# Patient Record
Sex: Female | Born: 1965
Health system: Southern US, Community
[De-identification: ages and names within clinical notes are randomized; demographics above are authoritative.]

## PROBLEM LIST (undated history)

## (undated) DIAGNOSIS — F32A Depression, unspecified: Secondary | ICD-10-CM

## (undated) DIAGNOSIS — F329 Major depressive disorder, single episode, unspecified: Secondary | ICD-10-CM

## (undated) HISTORY — DX: Depression, unspecified: F32.A

---
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
## 2018-07-11 DIAGNOSIS — Z01419 Encounter for gynecological examination (general) (routine) without abnormal findings: Secondary | ICD-10-CM | POA: Diagnosis not present

## 2018-07-11 DIAGNOSIS — Z0000 Encounter for general adult medical examination without abnormal findings: Secondary | ICD-10-CM | POA: Diagnosis not present

## 2018-07-11 DIAGNOSIS — Z6836 Body mass index (BMI) 36.0-36.9, adult: Secondary | ICD-10-CM | POA: Diagnosis not present

## 2018-07-11 DIAGNOSIS — Z30433 Encounter for removal and reinsertion of intrauterine contraceptive device: Secondary | ICD-10-CM | POA: Diagnosis not present

## 2018-07-27 DIAGNOSIS — Z1212 Encounter for screening for malignant neoplasm of rectum: Secondary | ICD-10-CM | POA: Diagnosis not present

## 2018-07-27 DIAGNOSIS — Z1211 Encounter for screening for malignant neoplasm of colon: Secondary | ICD-10-CM | POA: Diagnosis not present

## 2018-07-29 DIAGNOSIS — R928 Other abnormal and inconclusive findings on diagnostic imaging of breast: Secondary | ICD-10-CM | POA: Diagnosis not present

## 2018-11-03 DIAGNOSIS — Z0000 Encounter for general adult medical examination without abnormal findings: Secondary | ICD-10-CM | POA: Diagnosis not present

## 2018-11-03 DIAGNOSIS — F329 Major depressive disorder, single episode, unspecified: Secondary | ICD-10-CM | POA: Diagnosis not present

## 2018-11-03 DIAGNOSIS — R635 Abnormal weight gain: Secondary | ICD-10-CM | POA: Diagnosis not present

## 2018-11-03 DIAGNOSIS — Z6835 Body mass index (BMI) 35.0-35.9, adult: Secondary | ICD-10-CM | POA: Diagnosis not present

## 2018-12-03 DIAGNOSIS — F329 Major depressive disorder, single episode, unspecified: Secondary | ICD-10-CM | POA: Diagnosis not present

## 2018-12-03 DIAGNOSIS — G4700 Insomnia, unspecified: Secondary | ICD-10-CM | POA: Diagnosis not present

## 2018-12-03 DIAGNOSIS — R635 Abnormal weight gain: Secondary | ICD-10-CM | POA: Diagnosis not present

## 2019-01-05 DIAGNOSIS — G4700 Insomnia, unspecified: Secondary | ICD-10-CM | POA: Diagnosis not present

## 2019-01-05 DIAGNOSIS — F419 Anxiety disorder, unspecified: Secondary | ICD-10-CM | POA: Diagnosis not present

## 2019-01-05 DIAGNOSIS — R635 Abnormal weight gain: Secondary | ICD-10-CM | POA: Diagnosis not present

## 2019-02-09 DIAGNOSIS — F419 Anxiety disorder, unspecified: Secondary | ICD-10-CM | POA: Diagnosis not present

## 2019-02-09 DIAGNOSIS — R531 Weakness: Secondary | ICD-10-CM | POA: Diagnosis not present

## 2019-02-09 DIAGNOSIS — Z13220 Encounter for screening for lipoid disorders: Secondary | ICD-10-CM | POA: Diagnosis not present

## 2019-02-09 DIAGNOSIS — R112 Nausea with vomiting, unspecified: Secondary | ICD-10-CM | POA: Diagnosis not present

## 2019-02-24 DIAGNOSIS — R42 Dizziness and giddiness: Secondary | ICD-10-CM | POA: Diagnosis not present

## 2019-02-24 DIAGNOSIS — R531 Weakness: Secondary | ICD-10-CM | POA: Diagnosis not present

## 2019-02-24 DIAGNOSIS — G4709 Other insomnia: Secondary | ICD-10-CM | POA: Diagnosis not present

## 2019-02-24 DIAGNOSIS — E538 Deficiency of other specified B group vitamins: Secondary | ICD-10-CM | POA: Diagnosis not present

## 2019-03-11 DIAGNOSIS — G4709 Other insomnia: Secondary | ICD-10-CM | POA: Diagnosis not present

## 2019-03-11 DIAGNOSIS — F419 Anxiety disorder, unspecified: Secondary | ICD-10-CM | POA: Diagnosis not present

## 2019-03-31 DIAGNOSIS — R42 Dizziness and giddiness: Secondary | ICD-10-CM | POA: Diagnosis not present

## 2019-03-31 DIAGNOSIS — R61 Generalized hyperhidrosis: Secondary | ICD-10-CM | POA: Diagnosis not present

## 2019-05-25 DIAGNOSIS — R42 Dizziness and giddiness: Secondary | ICD-10-CM | POA: Diagnosis not present

## 2019-05-25 DIAGNOSIS — R61 Generalized hyperhidrosis: Secondary | ICD-10-CM | POA: Diagnosis not present

## 2019-07-06 DIAGNOSIS — M542 Cervicalgia: Secondary | ICD-10-CM | POA: Diagnosis not present

## 2019-07-06 DIAGNOSIS — F419 Anxiety disorder, unspecified: Secondary | ICD-10-CM | POA: Diagnosis not present

## 2019-07-06 DIAGNOSIS — M545 Low back pain: Secondary | ICD-10-CM | POA: Diagnosis not present

## 2019-07-23 DIAGNOSIS — F419 Anxiety disorder, unspecified: Secondary | ICD-10-CM | POA: Diagnosis not present

## 2019-07-23 DIAGNOSIS — M545 Low back pain: Secondary | ICD-10-CM | POA: Diagnosis not present

## 2019-07-24 DIAGNOSIS — Z01419 Encounter for gynecological examination (general) (routine) without abnormal findings: Secondary | ICD-10-CM | POA: Diagnosis not present

## 2019-07-24 DIAGNOSIS — Z6838 Body mass index (BMI) 38.0-38.9, adult: Secondary | ICD-10-CM | POA: Diagnosis not present

## 2019-07-24 DIAGNOSIS — Z13220 Encounter for screening for lipoid disorders: Secondary | ICD-10-CM | POA: Diagnosis not present

## 2019-07-24 DIAGNOSIS — Z0000 Encounter for general adult medical examination without abnormal findings: Secondary | ICD-10-CM | POA: Diagnosis not present

## 2019-07-24 LAB — TSH: TSH: 0.59 (ref 0.41–5.90)

## 2019-07-24 LAB — HEPATIC FUNCTION PANEL
ALT: 14 (ref 7–35)
AST: 19 (ref 13–35)

## 2019-07-24 LAB — LIPID PANEL
Cholesterol: 166 (ref 0–200)
HDL: 58 (ref 35–70)
LDL Cholesterol: 105
Triglycerides: 53 (ref 40–160)

## 2019-07-24 LAB — BASIC METABOLIC PANEL
Creatinine: 0.9 (ref 0.5–1.1)
Glucose: 77

## 2019-07-24 LAB — CBC AND DIFFERENTIAL
Hemoglobin: 13.3 (ref 12.0–16.0)
WBC: 6.4

## 2019-07-24 LAB — COLOGUARD: Cologuard: NEGATIVE

## 2019-08-01 DIAGNOSIS — Z1231 Encounter for screening mammogram for malignant neoplasm of breast: Secondary | ICD-10-CM | POA: Diagnosis not present

## 2019-08-13 ENCOUNTER — Ambulatory Visit (INDEPENDENT_AMBULATORY_CARE_PROVIDER_SITE_OTHER): Payer: BC Managed Care – PPO | Admitting: Osteopathic Medicine

## 2019-08-13 ENCOUNTER — Encounter: Payer: Self-pay | Admitting: Osteopathic Medicine

## 2019-08-13 ENCOUNTER — Other Ambulatory Visit: Payer: Self-pay

## 2019-08-13 VITALS — BP 134/86 | HR 86 | Temp 98.5°F | Ht 67.0 in | Wt 243.1 lb

## 2019-08-13 DIAGNOSIS — R635 Abnormal weight gain: Secondary | ICD-10-CM

## 2019-08-13 DIAGNOSIS — F419 Anxiety disorder, unspecified: Secondary | ICD-10-CM

## 2019-08-13 NOTE — Progress Notes (Signed)
Stephanie Rhodes is a 54 y.o. female who presents to  [facility] at [facility]  today, 08/13/19, seeking care for the following: Establish care Weight concerns  Anxiety concerns     ASSESSMENT & PLAN with other pertinent history/findings:  The primary encounter diagnosis was Anxiety. A diagnosis of Unintended weight gain was also pertinent to this visit.  Requesting records from [facility]: Pap, Mammo, Cologuard, Labs   Patient Instructions  INCREASE Lexapro from 10 mg to 20 mg May consider taking this in the evening   INCREASE Hydroxyzine to 25 or 50 mg (let me know if you need refills)   Work on diet/exercise as below. Will consider appetite suppressants, but let's work on mental health first, and let me review records from labs from [place].        Weight loss: important things to remember  It is hard work! You will have setbacks, but don't get discouraged. The goal is not short-term success, it is long-term health.   Looking at the numbers is important to track your progress and set goals, but how you are feeling and your overall health are the most important things! BMI and pounds and calories and miles logged aren't everything - they are tools to help [place] reach your goals.  You can do this!!!   Things to remember for exercise for weight loss:   Please note - I am not a certified personal trainer. I can present you with ideas and general workout goals, but an exercise program is largely up to you. Find something you can stick with, and something you enjoy!   As you progress in your exercise regimen think about gradually increasing the following, week by week:   intensity (how strenuous is your workout)  frequency (how often you are exercising)  duration (how many minutes at a time you are exercising)  Walking for 20 minutes a day is certainly better than nothing, but more strenuous exercise will develop better  cardiovascular fitness.   interval training (high-intensity alternating with low-intensity, think walk/jog rather than just walk)  muscle strengthening exercises (weight lifting, calisthenics, yoga) - this also helps prevent osteoporosis!   Things to remember for diet changes for weight loss:   Please note - I am not a certified dietician. I can present you with ideas and general diet goals, but a meal plan is largely up to you. I am happy to refer you to a dietician who can give you a detailed meal plan.  Apps/logs are crucial to track how you're eating! It's not realistic to be logging everything you eat forever, but when you're starting a healthy eating lifestyle it's very helpful, and checking in with logs now and then helps you stick to your program!   Calorie restriction with the goal weight loss of no more than one to one and a half pounds per week.   Increase lean protein such as chicken, fish, [place].   Decrease fatty foods such as dairy, butter.   Decrease sugary foods. Avoid sugary drinks such as soda or juice.  Increase fiber found in fruit and vegetables.   Medications approved for long-term use for obesity  Qsymia (Phentermine and Topiramate)  Saxenda (Liraglutide injection daily), Ozempic (Semaglutide injection weekly)  Contrave (Bupropion and Naltrexone)  Orlistat (Xenical, Alli)  Bupropion (Wellbutrin) I recommend that you research the above medications and see which one(s) your insurance may or may not cover: If you call your insurance, ask them specifically  what medications are on their formulary that are approved for obesity treatment. They should be able to send you a list or tell you over the phone. Remember, medications aren't magic! You MUST be diligent about lifestyle changes as well!       No orders of the defined types were placed in this encounter.   No orders of the defined types were placed in this encounter.      Follow-up instructions:  Return in about 4 weeks (around 09/10/2019) for VIRTUAL VISIT, RECHECK Short.                                         BP 134/86 (BP Location: Left Arm, Patient Position: Sitting, Cuff Size: Normal)   Pulse 86   Temp 98.5 F (36.9 C) (Oral)   Ht 5\' 7"  (1.702 m)   Wt 243 lb 1.3 oz (110.3 kg)   BMI 38.07 kg/m   Current Meds  Medication Sig  . escitalopram (LEXAPRO) 10 MG tablet escitalopram 10 mg tablet  . hydrOXYzine (ATARAX/VISTARIL) 25 MG tablet hydroxyzine HCl 25 mg tablet  TAKE ONE TABLET (25 MG DOSE) BY MOUTH AT BEDTIME AS NEEDED FOR ITCHING.  Marland Kitchen levonorgestrel (MIRENA, 52 MG,) 20 MCG/24HR IUD Mirena 20 mcg/24 hours (6 yrs) 52 mg intrauterine device  Take 1 device by intrauterine route.    No results found for this or any previous visit (from the past 72 hour(s)).  No results found.  Depression screen [facility] 2/9 08/13/2019  Decreased Interest 0  Down, Depressed, Hopeless 0  PHQ - 2 Score 0  Altered sleeping 3  Tired, decreased energy 2  Change in appetite 3  Feeling bad or failure about yourself  1  Trouble concentrating 0  Moving slowly or fidgety/restless 0  Suicidal thoughts 0  PHQ-9 Score 9  Difficult doing work/chores Somewhat difficult    GAD 7 : Generalized Anxiety Score 08/13/2019  Nervous, Anxious, on Edge 2  Control/stop worrying 2  Worry too much - different things 2  Trouble relaxing 1  Restless 1  Easily annoyed or irritable 2  Afraid - awful might happen 0  Total GAD 7 Score 10  Anxiety Difficulty Somewhat difficult      All questions at time of visit were answered - patient instructed to contact office with any additional concerns or updates.  ER/RTC precautions were reviewed with the patient.  Please note: voice recognition software was used to produce this document, and typos may escape review. Please contact Dr. Sheppard Coil for any needed clarifications.

## 2019-08-13 NOTE — Patient Instructions (Addendum)
INCREASE Lexapro from 10 mg to 20 mg May consider taking this in the evening   INCREASE Hydroxyzine to 25 or 50 mg (let me know if you need refills)   Work on diet/exercise as below. Will consider appetite suppressants, but let's work on mental health first, and let me review records from labs from [place].        Weight loss: important things to remember  It is hard work! You will have setbacks, but don't get discouraged. The goal is not short-term success, it is long-term health.   Looking at the numbers is important to track your progress and set goals, but how you are feeling and your overall health are the most important things! BMI and pounds and calories and miles logged aren't everything - they are tools to help [place] reach your goals.  You can do this!!!   Things to remember for exercise for weight loss:   Please note - I am not a certified personal trainer. I can present you with ideas and general workout goals, but an exercise program is largely up to you. Find something you can stick with, and something you enjoy!   As you progress in your exercise regimen think about gradually increasing the following, week by week:   intensity (how strenuous is your workout)  frequency (how often you are exercising)  duration (how many minutes at a time you are exercising)  Walking for 20 minutes a day is certainly better than nothing, but more strenuous exercise will develop better cardiovascular fitness.   interval training (high-intensity alternating with low-intensity, think walk/jog rather than just walk)  muscle strengthening exercises (weight lifting, calisthenics, yoga) - this also helps prevent osteoporosis!   Things to remember for diet changes for weight loss:   Please note - I am not a certified dietician. I can present you with ideas and general diet goals, but a meal plan is largely up to you. I am happy to refer you to a dietician who can give you a detailed meal  plan.  Apps/logs are crucial to track how you're eating! It's not realistic to be logging everything you eat forever, but when you're starting a healthy eating lifestyle it's very helpful, and checking in with logs now and then helps you stick to your program!   Calorie restriction with the goal weight loss of no more than one to one and a half pounds per week.   Increase lean protein such as chicken, fish, [place].   Decrease fatty foods such as dairy, butter.   Decrease sugary foods. Avoid sugary drinks such as soda or juice.  Increase fiber found in fruit and vegetables.   Medications approved for long-term use for obesity  Qsymia (Phentermine and Topiramate)  Saxenda (Liraglutide injection daily), Ozempic (Semaglutide injection weekly)  Contrave (Bupropion and Naltrexone)  Orlistat (Xenical, Alli)  Bupropion (Wellbutrin) I recommend that you research the above medications and see which one(s) your insurance may or may not cover: If you call your insurance, ask them specifically what medications are on their formulary that are approved for obesity treatment. They should be able to send you a list or tell you over the phone. Remember, medications aren't magic! You MUST be diligent about lifestyle changes as well!

## 2019-09-14 ENCOUNTER — Ambulatory Visit (INDEPENDENT_AMBULATORY_CARE_PROVIDER_SITE_OTHER): Payer: BC Managed Care – PPO

## 2019-09-14 ENCOUNTER — Ambulatory Visit (INDEPENDENT_AMBULATORY_CARE_PROVIDER_SITE_OTHER): Payer: BC Managed Care – PPO | Admitting: Sports Medicine

## 2019-09-14 ENCOUNTER — Other Ambulatory Visit: Payer: Self-pay

## 2019-09-14 ENCOUNTER — Encounter: Payer: Self-pay | Admitting: Sports Medicine

## 2019-09-14 DIAGNOSIS — M5030 Other cervical disc degeneration, unspecified cervical region: Secondary | ICD-10-CM | POA: Diagnosis not present

## 2019-09-14 DIAGNOSIS — M5412 Radiculopathy, cervical region: Secondary | ICD-10-CM | POA: Diagnosis not present

## 2019-09-14 MED ORDER — MELOXICAM 15 MG PO TABS: ORAL_TABLET | ORAL | 3 refills | Status: DC

## 2019-09-14 MED ORDER — PREDNISONE 50 MG PO TABS: ORAL_TABLET | ORAL | 0 refills | Status: DC

## 2019-09-14 NOTE — Progress Notes (Signed)
    Procedures performed today:    None.  Independent interpretation of notes and tests performed by another provider:   None.  Brief History, Exam, Impression, and Recommendations:    Radiculitis of right cervical region Stephanie Rhodes is a very pleasant 54 year old female, for several months now she has had pain in the back of her neck, between her shoulder blades with radiation over the right deltoid and down the arm with occasional numbness and tingling in the hands at night. This is worse with prolonged downgaze, no trauma, no constitutional symptoms, no progressive weakness. Her exam is unremarkable, good reflexes. She likely has cervical DDD, adding 5 days of prednisone, meloxicam, x-rays, home physical therapy, she declines formal PT. Return to see me in 6 weeks, MRI for interventional planning if no better.    ___________________________________________ Ihor Austin. Benjamin Stain, M.D., ABFM., CAQSM. Primary Care and Sports Medicine [place] [facility]  Adjunct Instructor of Family Medicine  [employer]

## 2019-09-14 NOTE — Assessment & Plan Note (Signed)
Stephanie Rhodes is a very pleasant 54 year old female, for several months now she has had pain in the back of her neck, between her shoulder blades with radiation over the right deltoid and down the arm with occasional numbness and tingling in the hands at night. This is worse with prolonged downgaze, no trauma, no constitutional symptoms, no progressive weakness. Her exam is unremarkable, good reflexes. She likely has cervical DDD, adding 5 days of prednisone, meloxicam, x-rays, home physical therapy, she declines formal PT. Return to see me in 6 weeks, MRI for interventional planning if no better.

## 2019-09-16 ENCOUNTER — Encounter: Payer: Self-pay | Admitting: Osteopathic Medicine

## 2019-09-24 ENCOUNTER — Encounter: Payer: Self-pay | Admitting: Osteopathic Medicine

## 2019-09-24 ENCOUNTER — Telehealth (INDEPENDENT_AMBULATORY_CARE_PROVIDER_SITE_OTHER): Payer: BC Managed Care – PPO | Admitting: Osteopathic Medicine

## 2019-09-24 VITALS — Wt 230.0 lb

## 2019-09-24 DIAGNOSIS — R635 Abnormal weight gain: Secondary | ICD-10-CM

## 2019-09-24 DIAGNOSIS — F419 Anxiety disorder, unspecified: Secondary | ICD-10-CM

## 2019-09-24 MED ORDER — ESCITALOPRAM OXALATE 20 MG PO TABS: 20.0000 mg | ORAL_TABLET | Freq: Every day | ORAL | 0 refills | Status: DC

## 2019-09-24 MED ORDER — HYDROXYZINE HCL 50 MG PO TABS: 50.0000 mg | ORAL_TABLET | Freq: Three times a day (TID) | ORAL | 1 refills | Status: DC | PRN

## 2019-09-24 MED ORDER — BUPROPION HCL ER (XL) 150 MG PO TB24
150.0000 mg | ORAL_TABLET | ORAL | 0 refills | Status: DC
Start: 2019-09-24 — End: 2019-11-10

## 2019-09-24 NOTE — Progress Notes (Signed)
Virtual Visit via Video (App used: Doximity) Note  I connected with      Stephanie Rhodes on 09/24/19 at 7:11 AM  by a telemedicine application and verified that I am speaking with the correct person using two identifiers.  Patient is at home I am in office   I discussed the limitations of evaluation and management by telemedicine and the availability of in person appointments. The patient expressed understanding and agreed to proceed.  History of Present Illness: Stephanie Rhodes is a 54 y.o. female who would like to discuss follow up anxiety - about 6 weeks ago we increased lexapro from 10 mg to 20 mg, and hydroxyzine prn to 25 or 50 mg. Pt was also concerned about unintended weight gain at that time.   Today, pt reports still under stress but overall feeling okay and improved. Feels like   Was on Wellbutrin, this didn't seem to work much for weigh tloss, was on this monoitherapy years ago   Was on Clonazpem long time ago daily use     Depression screen [facility] 2/9 09/24/2019 08/13/2019  Decreased Interest 0 0  Down, Depressed, Hopeless 0 0  PHQ - 2 Score 0 0  Altered sleeping 3 3  Tired, decreased energy 1 2  Change in appetite 3 3  Feeling bad or failure about yourself  0 1  Trouble concentrating 0 0  Moving slowly or fidgety/restless 0 0  Suicidal thoughts 0 0  PHQ-9 Score 7 9  Difficult doing work/chores Not difficult at all Somewhat difficult   GAD 7 : Generalized Anxiety Score 09/24/2019 08/13/2019  Nervous, Anxious, on Edge 0 2  Control/stop worrying 0 2  Worry too much - different things 1 2  Trouble relaxing 1 1  Restless 1 1  Easily annoyed or irritable 1 2  Afraid - awful might happen 0 0  Total GAD 7 Score 4 10  Anxiety Difficulty Not difficult at all Somewhat difficult    Wt Readings from Last 3 Encounters:  09/24/19 230 lb (104.3 kg)  09/14/19 244 lb (110.7 kg)  08/13/19 243 lb 1.3 oz (110.3 kg)       Observations/Objective: Wt 230 lb (104.3 kg)    BMI 36.02 kg/m  BP Readings from Last 3 Encounters:  08/13/19 134/86   Exam: Normal Speech.  NAD  Lab and Radiology Results No results found for this or any previous visit (from the past 72 hour(s)). No results found.     Assessment and Plan: 54 y.o. female with The primary encounter diagnosis was Anxiety. A diagnosis of Unintended weight gain was also pertinent to this visit.  Anxiety - will trial dual therapy w/ Lexapro 20 + Wellbutrin 150, advised against daily benzo use, will not Rx at this time. Would consider Trintellix or Viibryd if the above combo not helpful for mental health / weight.   PDMP not reviewed this encounter. No orders of the defined types were placed in this encounter.  Meds ordered this encounter  Medications  . escitalopram (LEXAPRO) 20 MG tablet    Sig: Take 1 tablet (20 mg total) by mouth daily.    Dispense:  90 tablet    Refill:  0  . hydrOXYzine (ATARAX/VISTARIL) 50 MG tablet    Sig: Take 1 tablet (50 mg total) by mouth every 8 (eight) hours as needed for anxiety.    Dispense:  90 tablet    Refill:  1  . buPROPion (WELLBUTRIN XL) 150 MG 24 hr tablet  Sig: Take 1 tablet (150 mg total) by mouth every morning.    Dispense:  90 tablet    Refill:  0   There are no Patient Instructions on file for this visit.  Instructions sent via MyChart. If MyChart not available, pt was given option for info via personal e-mail w/ no guarantee of protected health info over unsecured e-mail communication, and MyChart sign-up instructions were sent to patient.   Follow Up Instructions: Return in about 2 weeks (around 10/08/2019) for [place] .    I discussed the assessment and treatment plan with the patient. The patient was provided an opportunity to ask questions and all were answered. The patient agreed with the plan and demonstrated an understanding of the instructions.   The patient was  advised to call back or seek an in-person evaluation if any new concerns, if symptoms worsen or if the condition fails to improve as anticipated.  30 minutes of non-face-to-face time was provided during this encounter.      . . . . . . . . . . . . . Marland Kitchen                   Historical information moved to improve visibility of documentation.  Past Medical History:  Diagnosis Date  . Depression    Past Surgical History:  Procedure Laterality Date  . CESAREAN SECTION     Social History   Tobacco Use  . Smoking status: Former Research scientist (life sciences)  . Smokeless tobacco: Never Used  Substance Use Topics  . Alcohol use: Yes   family history is not on file.  Medications: Current Outpatient Medications  Medication Sig Dispense Refill  . escitalopram (LEXAPRO) 20 MG tablet Take 1 tablet (20 mg total) by mouth daily. 90 tablet 0  . hydrOXYzine (ATARAX/VISTARIL) 50 MG tablet Take 1 tablet (50 mg total) by mouth every 8 (eight) hours as needed for anxiety. 90 tablet 1  . levonorgestrel (MIRENA, 52 MG,) 20 MCG/24HR IUD Mirena 20 mcg/24 hours (6 yrs) 52 mg intrauterine device  Take 1 device by intrauterine route.    . meloxicam (MOBIC) 15 MG tablet One tab PO qAM with a meal for 2 weeks, then daily prn pain. 30 tablet 3  . predniSONE (DELTASONE) 50 MG tablet One tab PO daily for 5 days. 5 tablet 0  . buPROPion (WELLBUTRIN XL) 150 MG 24 hr tablet Take 1 tablet (150 mg total) by mouth every morning. 90 tablet 0   No current facility-administered medications for this visit.   No Known Allergies

## 2019-10-17 ENCOUNTER — Other Ambulatory Visit: Payer: Self-pay | Admitting: Osteopathic Medicine

## 2019-10-26 ENCOUNTER — Other Ambulatory Visit: Payer: Self-pay

## 2019-10-26 ENCOUNTER — Ambulatory Visit (INDEPENDENT_AMBULATORY_CARE_PROVIDER_SITE_OTHER): Payer: BC Managed Care – PPO | Admitting: Sports Medicine

## 2019-10-26 ENCOUNTER — Encounter: Payer: Self-pay | Admitting: Sports Medicine

## 2019-10-26 DIAGNOSIS — M5412 Radiculopathy, cervical region: Secondary | ICD-10-CM

## 2019-10-26 MED ORDER — GABAPENTIN 300 MG PO CAPS: ORAL_CAPSULE | ORAL | 3 refills | Status: DC

## 2019-10-26 NOTE — Assessment & Plan Note (Signed)
This is a pleasant 54 year old female, she has classic right-sided cervical radiculitis, she has axial periscapular pain with radiation down her right arm to her hand and fingertips, she endorses paresthesias in all of her fingers. She did not respond to prednisone, meloxicam, home physical therapy. Declined formal physical therapy. Today we agreed to add gabapentin in a slow up taper, I like to see her back in 1 month to evaluate relief. If no relief we will consider MRI for epidural planning. We discussed all of these modalities at the visit today.

## 2019-10-26 NOTE — Progress Notes (Signed)
    Procedures performed today:    None.  Independent interpretation of notes and tests performed by another provider:   None.  Brief History, Exam, Impression, and Recommendations:    Radiculitis of right cervical region This is a pleasant 54 year old female, she has classic right-sided cervical radiculitis, she has axial periscapular pain with radiation down her right arm to her hand and fingertips, she endorses paresthesias in all of her fingers. She did not respond to prednisone, meloxicam, home physical therapy. Declined formal physical therapy. Today we agreed to add gabapentin in a slow up taper, I like to see her back in 1 month to evaluate relief. If no relief we will consider MRI for epidural planning. We discussed all of these modalities at the visit today.    ___________________________________________ Ihor Austin. Benjamin Stain, M.D., ABFM., CAQSM. Primary Care and Sports Medicine [place] [facility]  Adjunct Instructor of Family Medicine  [employer]

## 2019-11-10 ENCOUNTER — Other Ambulatory Visit: Payer: Self-pay

## 2019-11-10 ENCOUNTER — Encounter: Payer: Self-pay | Admitting: Osteopathic Medicine

## 2019-11-10 ENCOUNTER — Ambulatory Visit (INDEPENDENT_AMBULATORY_CARE_PROVIDER_SITE_OTHER): Payer: BC Managed Care – PPO | Admitting: Osteopathic Medicine

## 2019-11-10 VITALS — BP 185/86 | HR 87 | Temp 97.0°F | Wt 235.0 lb

## 2019-11-10 DIAGNOSIS — F419 Anxiety disorder, unspecified: Secondary | ICD-10-CM | POA: Diagnosis not present

## 2019-11-10 DIAGNOSIS — R635 Abnormal weight gain: Secondary | ICD-10-CM

## 2019-11-10 MED ORDER — SERTRALINE HCL 50 MG PO TABS
50.0000 mg | ORAL_TABLET | Freq: Every day | ORAL | 0 refills | Status: DC
Start: 2019-11-10 — End: 2020-02-02

## 2019-11-10 NOTE — Progress Notes (Signed)
Stephanie Rhodes is a 54 y.o. female who presents to  [facility] at [facility]  today, 11/10/19, seeking care for the following:  . Follow up mental health - anxiety  o We had increased Lexapro from 10 to 20, and on subsequent visit added Wellbutrin to augment this as well as help with weight loss. Pt did not do well on the Wellbutrin and would like to switch this. o Previous mental health pharmacologic treatment:   - Lexapro 5 mg  - Wellbutrin monotherapy (looks like 12 hour formulation was Rd for daily use)  - Trazodone 50 mg qhs  - Clonazepam 0.5 daily prn  - Gabapentin - Hydroxyzine    Depression screen [facility] 2/9 11/10/2019 09/24/2019 08/13/2019  Decreased Interest 0 0 0  Down, Depressed, Hopeless 0 0 0  PHQ - 2 Score 0 0 0  Altered sleeping 2 3 3   Tired, decreased energy - 1 2  Change in appetite 0 3 3  Feeling bad or failure about yourself  0 0 1  Trouble concentrating 0 0 0  Moving slowly or fidgety/restless 0 0 0  Suicidal thoughts 0 0 0  PHQ-9 Score 2 7 9   Difficult doing work/chores Not difficult at all Not difficult at all Somewhat difficult   GAD 7 : Generalized Anxiety Score 11/10/2019 09/24/2019 08/13/2019  Nervous, Anxious, on Edge 1 0 2  Control/stop worrying 1 0 2  Worry too much - different things 1 1 2   Trouble relaxing 1 1 1   Restless 0 1 1  Easily annoyed or irritable 1 1 2   Afraid - awful might happen 0 0 0  Total GAD 7 Score 5 4 10   Anxiety Difficulty Somewhat difficult Not difficult at all Somewhat difficult      ASSESSMENT & PLAN with other pertinent findings:  The primary encounter diagnosis was Anxiety. A diagnosis of Unintended weight gain was also pertinent to this visit.   1. Anxiety Trial switch to ZOloft If this not helpful would consider Trintellix or Viibryd  2. Unintended weight gain Discussed diet/exercise modifications, there is room for improvement in both areas, may consider weight loss Rx  based on progress - would like to see patient lose some weight on her own before we commit to prescriptions.      There are no Patient Instructions on file for this visit.  No orders of the defined types were placed in this encounter.   Meds ordered this encounter  Medications  . sertraline (ZOLOFT) 50 MG tablet    Sig: Take 1 tablet (50 mg total) by mouth daily. Start with 0.5 tablet (25 mg total) by mouth daily for first week    Dispense:  90 tablet    Refill:  0       Follow-up instructions: Return in about 4 weeks (around 12/08/2019) for recheck weight/mental health .                                         BP (!) 185/86 (BP Location: Right Arm, Patient Position: Sitting)   Pulse 87   Temp (!) 97 F (36.1 C)   Wt 235 lb (106.6 kg)   SpO2 97%   BMI 36.81 kg/m   Current Meds  Medication Sig  . gabapentin (NEURONTIN) 300 MG capsule One tab PO qHS for a week, then BID for a week, then  TID. May double weekly to a max of 3,600mg /day  . hydrOXYzine (ATARAX/VISTARIL) 50 MG tablet Take 1 tablet (50 mg total) by mouth every 8 (eight) hours as needed for anxiety.  Marland Kitchen levonorgestrel (MIRENA, 52 MG,) 20 MCG/24HR IUD Mirena 20 mcg/24 hours (6 yrs) 52 mg intrauterine device  Take 1 device by intrauterine route.  . meloxicam (MOBIC) 15 MG tablet One tab PO qAM with a meal for 2 weeks, then daily prn pain.  . [DISCONTINUED] buPROPion (WELLBUTRIN XL) 150 MG 24 hr tablet Take 1 tablet (150 mg total) by mouth every morning.    No results found for this or any previous visit (from the past 72 hour(s)).  No results found.     All questions at time of visit were answered - patient instructed to contact office with any additional concerns or updates.  ER/RTC precautions were reviewed with the patient as applicable.   Please note: voice recognition software was used to produce this document, and typos may escape review. Please contact Dr. Lyn Hollingshead  for any needed clarifications.   Total encounter time: 30 minutes.

## 2019-11-23 ENCOUNTER — Ambulatory Visit: Payer: BC Managed Care – PPO | Admitting: Sports Medicine

## 2019-12-14 DIAGNOSIS — Z713 Dietary counseling and surveillance: Secondary | ICD-10-CM | POA: Diagnosis not present

## 2019-12-17 ENCOUNTER — Other Ambulatory Visit: Payer: Self-pay | Admitting: Osteopathic Medicine

## 2019-12-17 DIAGNOSIS — F419 Anxiety disorder, unspecified: Secondary | ICD-10-CM

## 2019-12-17 NOTE — Telephone Encounter (Signed)
Last ov-11/10/2019  Last refill- Unknown date by historical provider

## 2019-12-18 NOTE — Telephone Encounter (Signed)
Please add the medicines that she is taking according to our charts, please call patient to confirm if she requested these or if these are old medications?  She is overdue for follow-up anyway, can have her schedule virtual visit or an office visit to follow-up mental health   Follow-up instructions: Return in about 4 weeks (around 12/08/2019) for recheck weight/mental health .

## 2020-01-31 ENCOUNTER — Other Ambulatory Visit: Payer: Self-pay | Admitting: Osteopathic Medicine

## 2020-02-01 NOTE — Telephone Encounter (Signed)
Last refill- 12/17/2019 Last ov- 11/10/2019

## 2020-02-08 ENCOUNTER — Ambulatory Visit: Payer: BC Managed Care – PPO | Admitting: Sports Medicine

## 2020-02-08 ENCOUNTER — Other Ambulatory Visit: Payer: Self-pay

## 2020-02-08 ENCOUNTER — Encounter: Payer: Self-pay | Admitting: Sports Medicine

## 2020-02-08 DIAGNOSIS — M654 Radial styloid tenosynovitis [de Quervain]: Secondary | ICD-10-CM | POA: Diagnosis not present

## 2020-02-08 MED ORDER — MELOXICAM 15 MG PO TABS: ORAL_TABLET | ORAL | 3 refills | Status: DC

## 2020-02-08 NOTE — Progress Notes (Signed)
    Procedures performed today:    None.  Independent interpretation of notes and tests performed by another provider:   None.  Brief History, Exam, Impression, and Recommendations:    Stephanie Rhodes is a 54yo female who presents today with pain along the extensor portion along the right thumb for about a month. 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 She has a positive finkelstein sign and some swelling over the radial aspect of her wrist. This is classic for Dequarvians tenosynovitis. We are going to start conservatively with meloxicam, at home exercises, and a thumb spica. We will see her back in 4-6 weeks. If no better, we will inject the tendon.   Aurelio Jew, MS3   ___________________________________________ Stephanie Rhodes. Benjamin Stain, M.D., ABFM., CAQSM. Primary Care and Sports Medicine [place] [facility]  Adjunct Instructor of Family Medicine  [employer]

## 2020-02-08 NOTE — Assessment & Plan Note (Signed)
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 Significant repetitive motions daily, she has developed pain over the radial side of her wrist, on exam she has a positive Finkelstein sign as well as mild swelling, this is pathognomonic for de Quervain's tenosynovitis, adding a thumb spica brace to be worn at work and at night, adding meloxicam, rehab exercises given, return to see me in 1 month, injection if no better.

## 2020-02-09 ENCOUNTER — Other Ambulatory Visit: Payer: Self-pay | Admitting: Osteopathic Medicine

## 2020-02-17 ENCOUNTER — Other Ambulatory Visit: Payer: Self-pay | Admitting: Osteopathic Medicine

## 2020-02-24 DIAGNOSIS — Z713 Dietary counseling and surveillance: Secondary | ICD-10-CM | POA: Diagnosis not present

## 2020-02-26 DIAGNOSIS — H524 Presbyopia: Secondary | ICD-10-CM | POA: Diagnosis not present

## 2020-03-08 ENCOUNTER — Ambulatory Visit: Payer: BC Managed Care – PPO | Admitting: Sports Medicine

## 2020-03-08 ENCOUNTER — Ambulatory Visit (INDEPENDENT_AMBULATORY_CARE_PROVIDER_SITE_OTHER): Payer: BC Managed Care – PPO

## 2020-03-08 ENCOUNTER — Other Ambulatory Visit: Payer: Self-pay

## 2020-03-08 DIAGNOSIS — M654 Radial styloid tenosynovitis [de Quervain]: Secondary | ICD-10-CM

## 2020-03-08 NOTE — Progress Notes (Signed)
    Procedures performed today:    Procedure: Real-time Ultrasound Guided injection of the right first extensor compartment Device: Samsung HS60  Verbal informed consent obtained.  Time-out conducted.  Noted no overlying erythema, induration, or other signs of local infection.  Skin prepped in a sterile fashion.  Local anesthesia: Topical Ethyl chloride.  With sterile technique and under real time ultrasound guidance:  Noted intact appearing first extensor compartment tendons, 1 cc Kenalog 40, 1 cc lidocaine injected easily Completed without difficulty  Pain immediately resolved suggesting accurate placement of the medication.  Advised to call if fevers/chills, erythema, induration, drainage, or persistent bleeding.  Images permanently stored and available for review in PACS.  Impression: Technically successful ultrasound guided injection.  Independent interpretation of notes and tests performed by another provider:   None.  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 At the last visit we diagnosed her with de Quervain's tenosynovitis, thumb spica brace, NSAIDs were not helpful, first extensor compartment injection today, return to see me in a month.    ___________________________________________ Ihor Austin. Benjamin Stain, M.D., ABFM., CAQSM. Primary Care and Sports Medicine [facility]  Adjunct Instructor of Family Medicine  [employer]

## 2020-03-08 NOTE — Assessment & Plan Note (Signed)
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 At the last visit we diagnosed her with de Quervain's tenosynovitis, thumb spica brace, NSAIDs were not helpful, first extensor compartment injection today, return to see me in a month.

## 2020-04-06 ENCOUNTER — Ambulatory Visit: Payer: BC Managed Care – PPO | Admitting: Sports Medicine

## 2020-04-13 ENCOUNTER — Telehealth: Payer: Self-pay

## 2020-04-13 NOTE — Telephone Encounter (Signed)
Task completed. Pt has been informed of provider's recommendation. Agreeable with plan. Transferred to front desk for scheduling.

## 2020-04-13 NOTE — Telephone Encounter (Signed)
Pt called requesting if provider can increase the dosage on her zoloft rx. Currently taking 1 tab. Pls advise, thanks.

## 2020-04-13 NOTE — Telephone Encounter (Signed)
Last seen in June, was told to f/u 4 weeks after that visit... needs visit for medication change discussion, virtual is ok

## 2020-04-27 ENCOUNTER — Ambulatory Visit: Payer: BC Managed Care – PPO | Admitting: Osteopathic Medicine

## 2020-04-27 ENCOUNTER — Encounter: Payer: Self-pay | Admitting: Osteopathic Medicine

## 2020-04-27 ENCOUNTER — Other Ambulatory Visit: Payer: Self-pay

## 2020-04-27 VITALS — BP 126/83 | HR 87 | Ht 67.0 in | Wt 264.0 lb

## 2020-04-27 DIAGNOSIS — F411 Generalized anxiety disorder: Secondary | ICD-10-CM | POA: Diagnosis not present

## 2020-04-27 MED ORDER — ALPRAZOLAM 0.5 MG PO TABS: 0.2500 mg | ORAL_TABLET | Freq: Two times a day (BID) | ORAL | 0 refills | Status: DC | PRN

## 2020-04-27 MED ORDER — SERTRALINE HCL 100 MG PO TABS: 100.0000 mg | ORAL_TABLET | Freq: Every day | ORAL | 1 refills | Status: DC

## 2020-04-27 NOTE — Patient Instructions (Signed)
Increase Zoloft to 100 mg Xanax only as absolutely needed: 15 pills for at LEAST 30 days, ideally for 90 days  Will message you in 2 weeks to check in, further follow-up based on how you're feeling at that point!

## 2020-04-27 NOTE — Progress Notes (Signed)
Stephanie Rhodes is a 54 y.o. female who presents to  [facility] Primary Care & Sports Medicine at [facility]  today, 04/27/20, seeking care for the following:   Follow up mental health - anxiety  ? Last visit in 10/2019 (6 mos ago): "We had increased Lexapro from 10 to 20, and on subsequent visit added Wellbutrin to augment this as well as help with weight loss. Pt did not do well on the Wellbutrin and would like to switch this." We started Zoloft at that point  ? Has been on Zoloft 50 mg. Today 04/27/20 reports increased anxiety and stress w/ work and family, has never had therapy/counseling and not interested in that at this point but would like to trial increasing Zoloft  ? Previous mental health pharmacologic treatment:    Lexapro 5 mg - 20 mg  Wellbutrin monotherapy (looks like 12 hour formulation was Rx for daily use)   Trazodone 50 mg qhs   Clonazepam 0.5 daily prn   Gabapentin  Hydroxyzine   Depression screen [facility] 2/9 04/27/2020 11/10/2019 09/24/2019  Decreased Interest 0 0 0  Down, Depressed, Hopeless 0 0 0  PHQ - 2 Score 0 0 0  Altered sleeping 2 2 3   Tired, decreased energy 1 - 1  Change in appetite 3 0 3  Feeling bad or failure about yourself  1 0 0  Trouble concentrating 0 0 0  Moving slowly or fidgety/restless 0 0 0  Suicidal thoughts 0 0 0  PHQ-9 Score 7 2 7   Difficult doing work/chores Somewhat difficult Not difficult at all Not difficult at all       ASSESSMENT & PLAN with other pertinent findings:  The encounter diagnosis was Generalized anxiety disorder.   No results found for this or any previous visit (from the past 24 hour(s)).     Patient Instructions  Increase Zoloft to 100 mg Xanax only as absolutely needed: 15 pills for at LEAST 30 days, ideally for 90 days  Will message you in 2 weeks to check in, further follow-up based on how you're feeling at that point!    No orders of the defined types were placed in this  encounter.   Meds ordered this encounter  Medications  . sertraline (ZOLOFT) 100 MG tablet    Sig: Take 1 tablet (100 mg total) by mouth daily.    Dispense:  90 tablet    Refill:  1  . DISCONTD: ALPRAZolam (XANAX) 0.5 MG tablet    Sig: Take 0.5-1 tablets (0.25-0.5 mg total) by mouth 2 (two) times daily as needed for anxiety.    Dispense:  15 tablet    Refill:  0  . ALPRAZolam (XANAX) 0.5 MG tablet    Sig: Take 0.5-1 tablets (0.25-0.5 mg total) by mouth 2 (two) times daily as needed for anxiety.    Dispense:  15 tablet    Refill:  0       Follow-up instructions: Return for FOLLOW UP VIA MYCHART MESSAGE (SET TO SEND LATER).                                         BP 126/83   Pulse 87   Ht 5\' 7"  (1.702 m)   Wt 264 lb (119.7 kg)   BMI 41.35 kg/m   No outpatient medications have been marked as taking for the 04/27/20 encounter (Office Visit) with ,  DO.    No results found for this or any previous visit (from the past 72 hour(s)).  No results found.     All questions at time of visit were answered - patient instructed to contact office with any additional concerns or updates.  ER/RTC precautions were reviewed with the patient as applicable.   Please note: voice recognition software was used to produce this document, and typos may escape review. Please contact Dr. Lyn Hollingshead for any needed clarifications.

## 2020-05-18 DIAGNOSIS — Z713 Dietary counseling and surveillance: Secondary | ICD-10-CM | POA: Diagnosis not present

## 2020-05-20 IMAGING — DX DG CERVICAL SPINE COMPLETE 4+V
6 series · 7 of 7 positions shown · non-contrast
Comparison: None.

CLINICAL DATA: Neck and right shoulder pain for 2 months.

EXAM:
CERVICAL SPINE - COMPLETE 4+ VIEW

[c-spine lat]
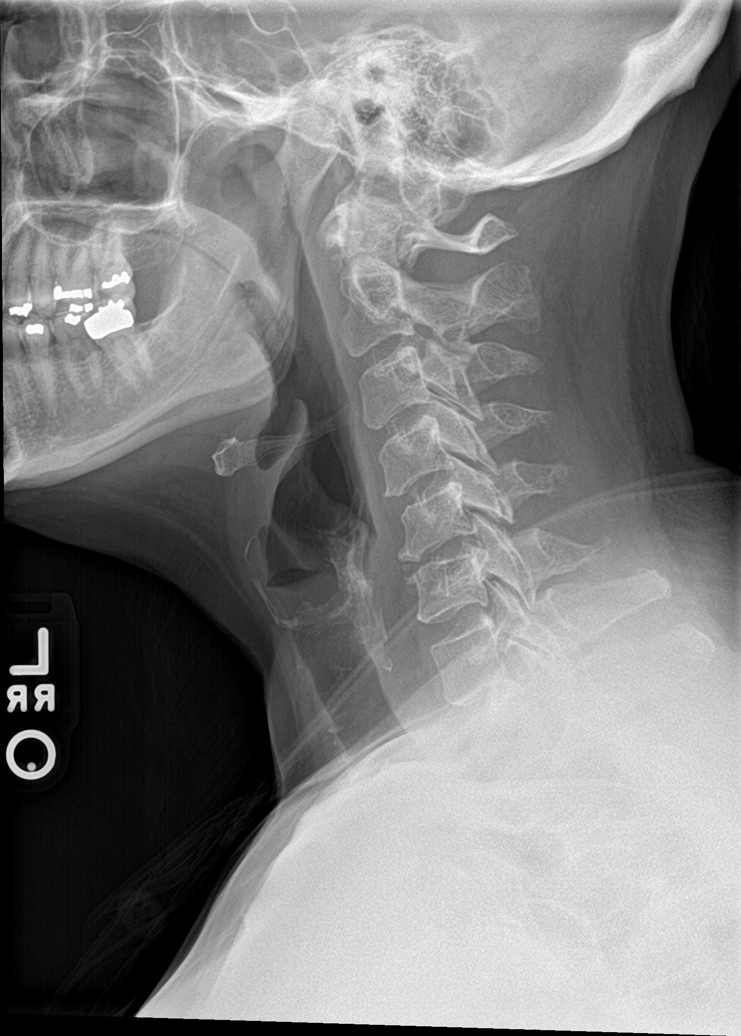

[c-spine obl (1 of 2)]
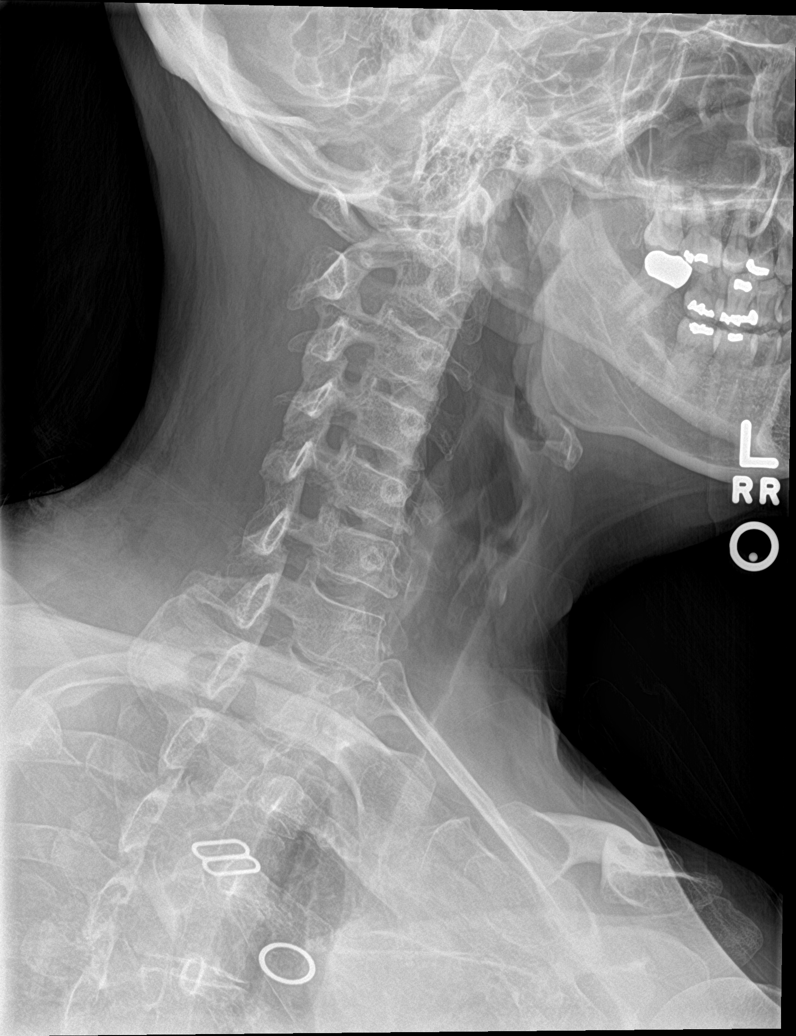

[c-spine obl (2 of 2)]
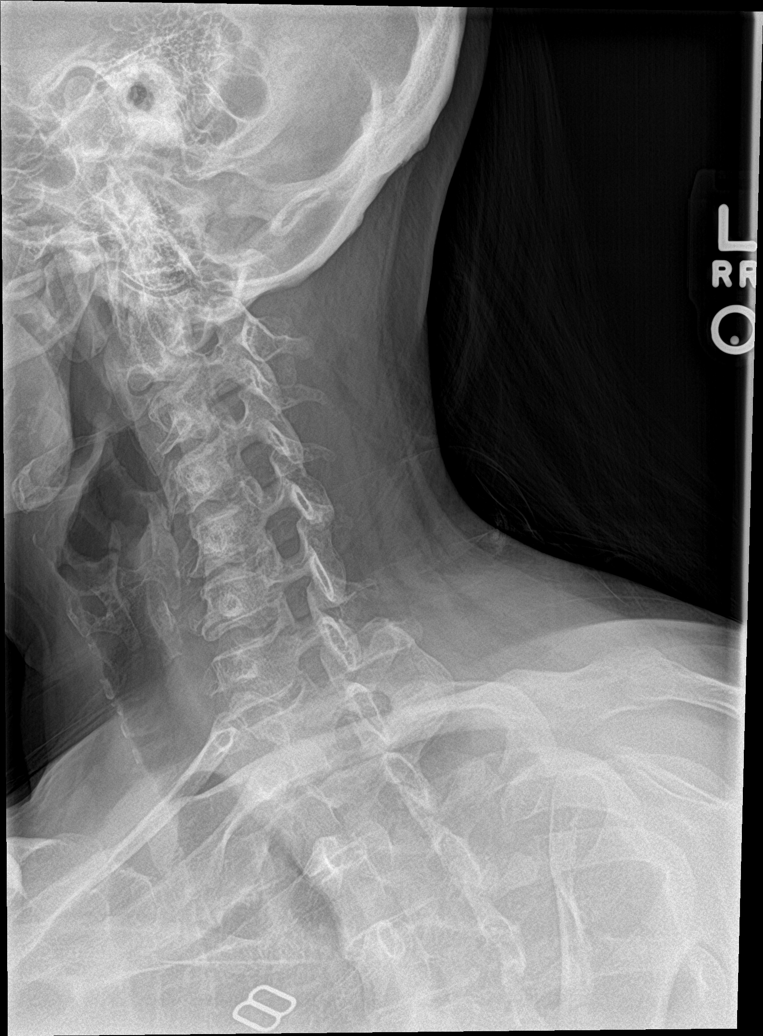

[c-spine ap]
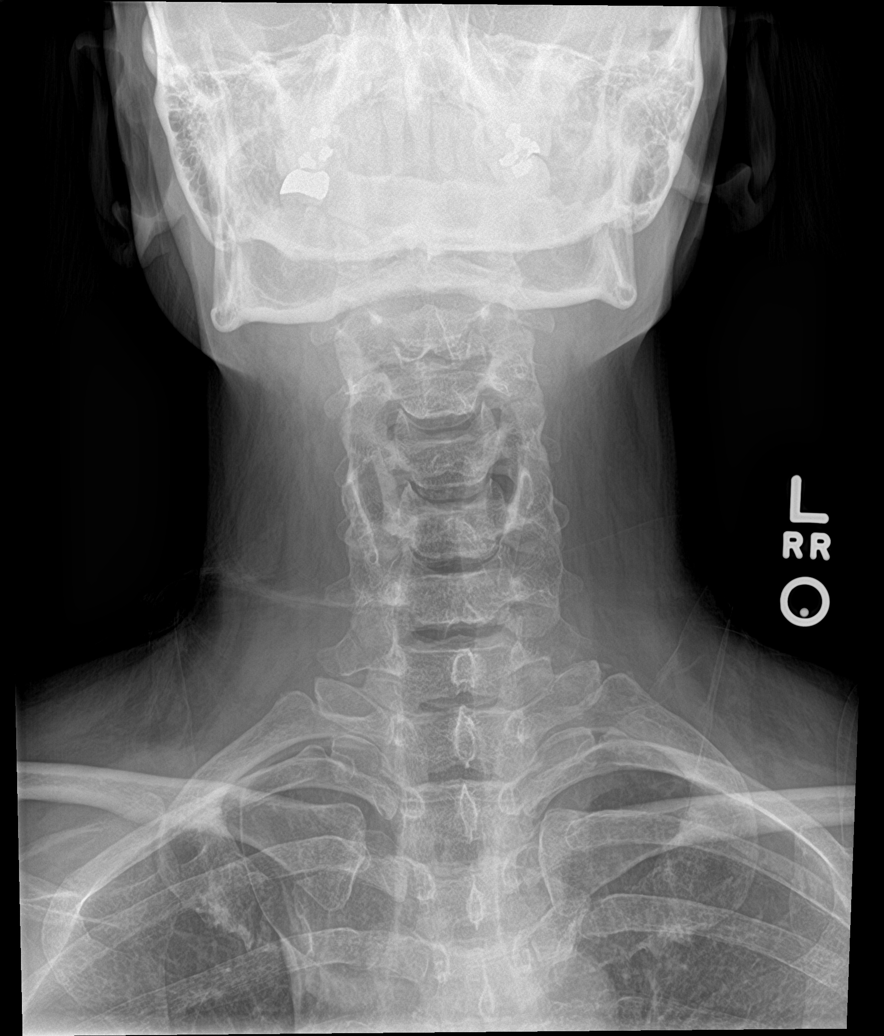

[Series 5: c-spine open mouth · 0.14mm/px · 2 of 2 slices shown]
[im 1/2]
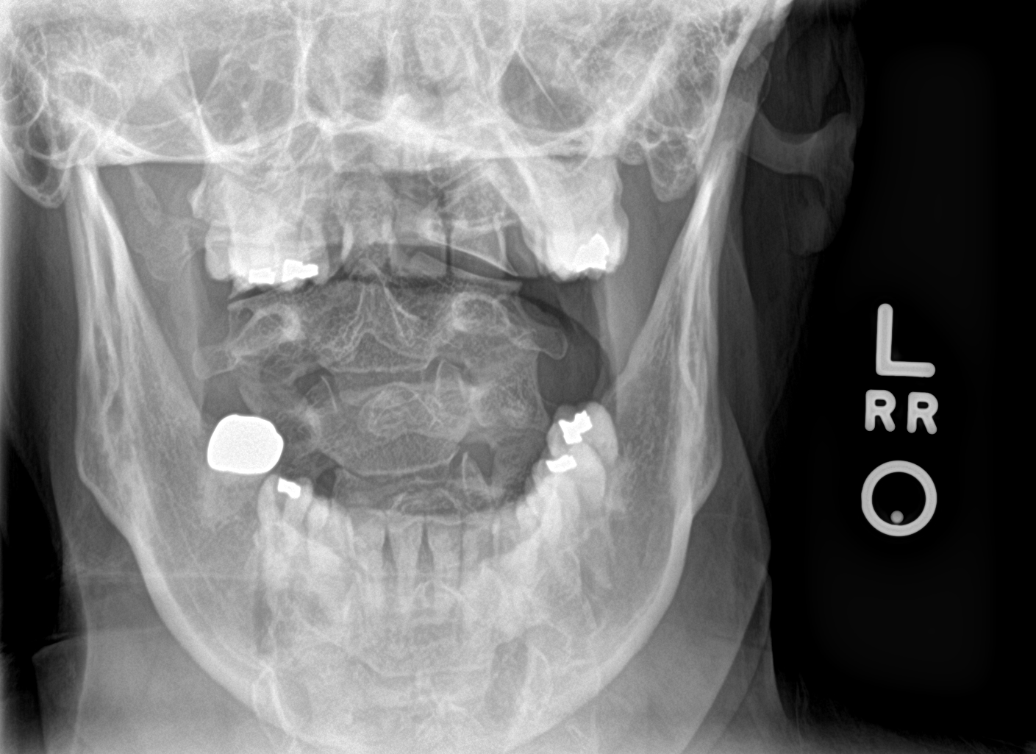
[im 2/2]
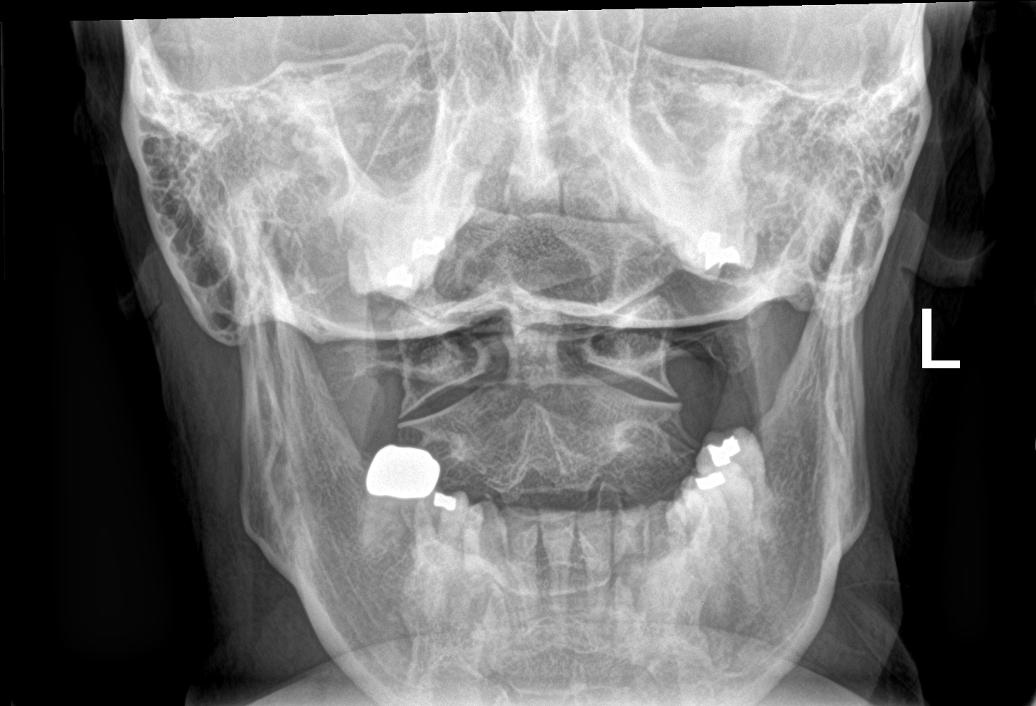

[c-spine swimmers]
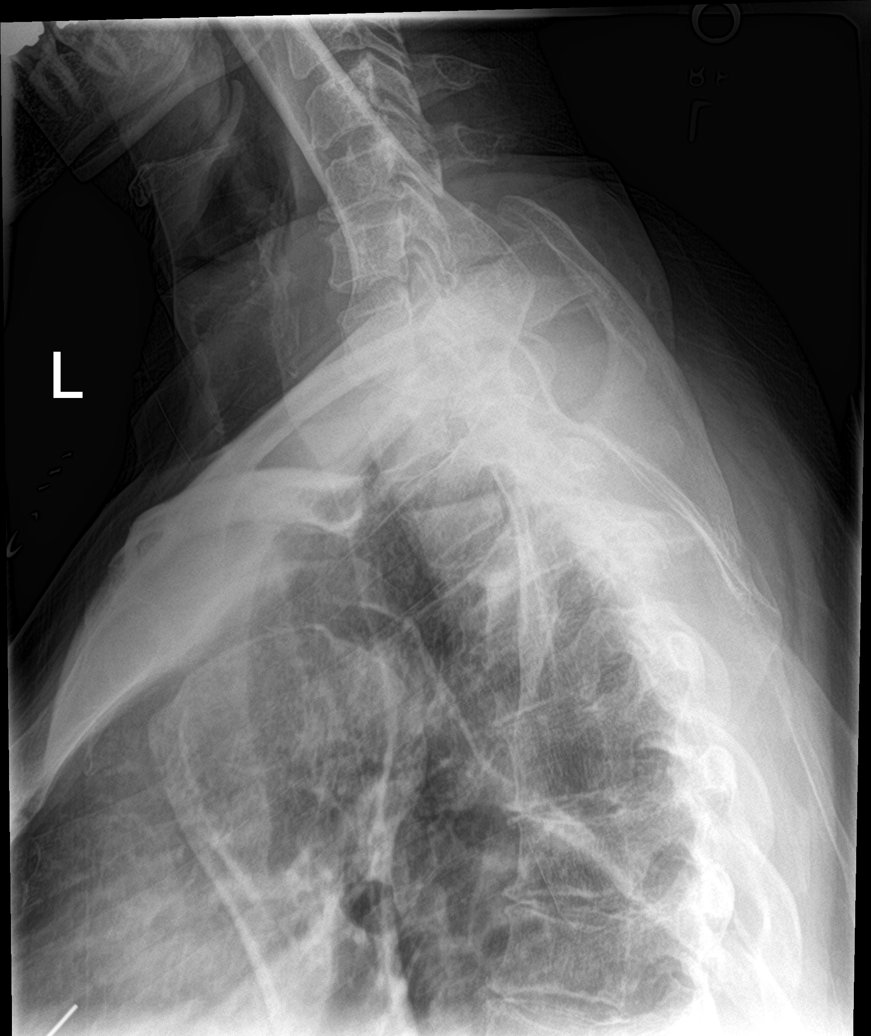

[7 of 7 positions shown; findings below may reference images not displayed]

FINDINGS: There is no evidence of cervical spine fracture or prevertebral soft
tissue swelling. Alignment is normal. Mild degenerative disc disease
is seen at C5-6. Other intervertebral disc spaces are maintained. No
significant facet arthropathy or neural foraminal stenosis noted.
IMPRESSION: No acute findings. Mild C5-6 degenerative disc disease.

## 2020-06-10 ENCOUNTER — Ambulatory Visit: Payer: Self-pay | Admitting: Osteopathic Medicine

## 2020-06-16 ENCOUNTER — Other Ambulatory Visit: Payer: Self-pay

## 2020-06-16 ENCOUNTER — Ambulatory Visit: Payer: BC Managed Care – PPO | Admitting: Osteopathic Medicine

## 2020-06-16 ENCOUNTER — Encounter: Payer: Self-pay | Admitting: Osteopathic Medicine

## 2020-06-16 VITALS — BP 145/92 | HR 72 | Temp 98.0°F | Wt 268.0 lb

## 2020-06-16 DIAGNOSIS — F411 Generalized anxiety disorder: Secondary | ICD-10-CM | POA: Diagnosis not present

## 2020-06-16 MED ORDER — VORTIOXETINE HBR 10 MG PO TABS: 10.0000 mg | ORAL_TABLET | Freq: Every day | ORAL | 0 refills | Status: DC

## 2020-06-16 MED ORDER — ALPRAZOLAM 1 MG PO TABS: 1.0000 mg | ORAL_TABLET | Freq: Two times a day (BID) | ORAL | 0 refills | Status: DC | PRN

## 2020-06-16 NOTE — Patient Instructions (Addendum)
Anxiety:   Will increase Xanax from 0.5 mg to 1 mg to use few times per week.   If that's helping, great!   If not really helpful, I'd like to try switching the Zoloft to Trintellix (new medication) - I sent the Rx for Trintellix into your pharmacy, we can expect this may require some authorization process since it's a brand name drug.   If we can't get the Trintellix covered will increase Zoloft to 150 mg      Weight loss: important things to remember  Things to remember for exercise for weight loss:   Please note - I am not a certified personal trainer. I can present you with ideas and general workout goals, but an exercise program is largely up to you. Find something you can stick with, and something you enjoy!   Slow progression will help prevent injury!  As you progress in your exercise regimen think about gradually increasing the following, week by week:   intensity (how strenuous is your workout?)  frequency (how often are you exercising?)  duration (how many minutes at a time are you exercising?)  Walking for 20 minutes a day is certainly better than nothing, but more strenuous exercise will develop better cardiovascular fitness.   interval training (high-intensity alternating with low-intensity, think walk/jog rather than just walk)  muscle strengthening exercises (weight lifting, calisthenics, yoga) - this also helps prevent osteoporosis!   Things to remember for diet changes for weight loss:   Please note - I am not a certified dietician. I can present you with ideas and general diet goals, but a meal plan is largely up to you. I am happy to refer you to a dietician who can give you a detailed meal plan.  Apps/logs can be helpful to track how you're eating! It's not realistic to be logging everything you eat forever, but when you're starting a healthy eating lifestyle it's very helpful, and checking in with logs now and then helps you stick to your program!    Calorie restriction / portion control with the goal weight loss of no more than one to one and a half pounds per week.   Increase lean protein such as chicken, fish, [place].   Decrease fatty foods such as dairy, butter.   Decrease sugary foods and sweets. Avoid sugary drinks such as soda or juice.  Increase fiber found in fruit and vegetables, whole grains.   Medications approved for long-term use for obesity  Qsymia (Phentermine + Topiramate) - may help migraines/headaches  Saxenda (Liraglutide daily injections) - will also help diabetes/prediabetes  Wegovy (Semaglutide weekly injections) - will also help diabetes/prediabetes  Contrave (Bupropion + Naltrexone) - may help mental health/depression   Orlistat (Xenical Rx, Alli OTC) - not my favorite, can cause diarrhea  Bupropion (Wellbutrin) - generic antidepressant, will also help mental health, may help with quitting smoking  I recommend that you research the above medications and see which one(s) your insurance may or may not cover: If you call your insurance, ask them specifically what medications are on their formulary that are approved for obesity treatment. They should be able to send you a list or tell you over the phone. Remember, medications aren't magic! You MUST be diligent about lifestyle changes as well!

## 2020-06-16 NOTE — Progress Notes (Signed)
Stephanie Rhodes is a 55 y.o. female who presents to  [facility] Primary Care & Sports Medicine at [facility]  today, 06/16/20, seeking care for the following:    Follow up mental health - anxiety   Today 06/16/20: reports doing well on current regimen Zoloft 100 mg daily and prn Xanax. Overall improved but still having significant anxiety issues   Most recent visit 04/27/20: "Increase Zoloft to 100 mg. Xanax only as absolutely needed: 15 pills for at LEAST 30 days, ideally for 90 days" ? Will message you in 2 weeks to check in, further follow-up based on how you're feeling at that point!  ? Visit in 10/2019: "We had increased Lexapro from 10 to 20, and on subsequent visit added Wellbutrin to augment this as well as help with weight loss. Pt did not do well on the Wellbutrin and would like to switch this." We started Zoloft at that point  ? Has been on Zoloft 50 mg. Today 04/27/20 reports increased anxiety and stress w/ work and family, has never had therapy/counseling and not interested in that at this point but would like to trial increasing Zoloft  ? Previous mental health pharmacologic treatment:   Lexapro 5 mg - 20 mg  Wellbutrin monotherapy (looks like 12 hour formulation was Rx for daily use)   Trazodone 50 mg qhs   Clonazepam 0.5 daily prn   Gabapentin  Hydroxyzine  Would like to start Rx for appetite suppression, since quitting smoking has had issue w/ weight gain   Wt Readings from Last 3 Encounters:  06/16/20 268 lb (121.6 kg)  04/27/20 264 lb (119.7 kg)  11/10/19 235 lb (106.6 kg)   Body mass index is 41.97 kg/m.     ASSESSMENT & PLAN with other pertinent findings:  The primary encounter diagnosis was Generalized anxiety disorder. A diagnosis of Obesity, morbid, BMI 40.0-49.9 (HCC) was also pertinent to this visit.   No results found for this or any previous visit (from the past 24 hour(s)).  Patient Instructions  Anxiety:   Will increase  Xanax from 0.5 mg to 1 mg to use few times per week.   If that's helping, great!   If not really helpful, I'd like to try switching the Zoloft to Trintellix (new medication) - I sent the Rx for Trintellix into your pharmacy, we can expect this may require some authorization process since it's a brand name drug.   If we can't get the Trintellix covered will increase Zoloft to 150 mg      Weight loss: important things to remember  Things to remember for exercise for weight loss:   Please note - I am not a certified personal trainer. I can present you with ideas and general workout goals, but an exercise program is largely up to you. Find something you can stick with, and something you enjoy!   Slow progression will help prevent injury!  As you progress in your exercise regimen think about gradually increasing the following, week by week:   intensity (how strenuous is your workout?)  frequency (how often are you exercising?)  duration (how many minutes at a time are you exercising?)  Walking for 20 minutes a day is certainly better than nothing, but more strenuous exercise will develop better cardiovascular fitness.   interval training (high-intensity alternating with low-intensity, think walk/jog rather than just walk)  muscle strengthening exercises (weight lifting, calisthenics, yoga) - this also helps prevent osteoporosis!   Things to remember for diet changes for  weight loss:   Please note - I am not a certified dietician. I can present you with ideas and general diet goals, but a meal plan is largely up to you. I am happy to refer you to a dietician who can give you a detailed meal plan.  Apps/logs can be helpful to track how you're eating! It's not realistic to be logging everything you eat forever, but when you're starting a healthy eating lifestyle it's very helpful, and checking in with logs now and then helps you stick to your program!   Calorie restriction / portion  control with the goal weight loss of no more than one to one and a half pounds per week.   Increase lean protein such as chicken, fish, [place].   Decrease fatty foods such as dairy, butter.   Decrease sugary foods and sweets. Avoid sugary drinks such as soda or juice.  Increase fiber found in fruit and vegetables, whole grains.   Medications approved for long-term use for obesity  Qsymia (Phentermine + Topiramate) - may help migraines/headaches  Saxenda (Liraglutide daily injections) - will also help diabetes/prediabetes  Wegovy (Semaglutide weekly injections) - will also help diabetes/prediabetes  Contrave (Bupropion + Naltrexone) - may help mental health/depression   Orlistat (Xenical Rx, Alli OTC) - not my favorite, can cause diarrhea  Bupropion (Wellbutrin) - generic antidepressant, will also help mental health, may help with quitting smoking  I recommend that you research the above medications and see which one(s) your insurance may or may not cover: If you call your insurance, ask them specifically what medications are on their formulary that are approved for obesity treatment. They should be able to send you a list or tell you over the phone. Remember, medications aren't magic! You MUST be diligent about lifestyle changes as well!       No orders of the defined types were placed in this encounter.   Meds ordered this encounter  Medications  . ALPRAZolam (XANAX) 1 MG tablet    Sig: Take 1 tablet (1 mg total) by mouth 2 (two) times daily as needed for anxiety.    Dispense:  30 tablet    Refill:  0  . vortioxetine HBr (TRINTELLIX) 10 MG TABS tablet    Sig: Take 1 tablet (10 mg total) by mouth daily.    Dispense:  90 tablet    Refill:  0       Follow-up instructions: Return in about 6 weeks (around 07/28/2020) for IN-OFFICE VISIT - RECHECK BLOOD PRESSURE, WEIGHT LOSS .                                         BP (!) 145/92 (BP  Location: Left Arm, Patient Position: Sitting, Cuff Size: Large)   Pulse 72   Temp 98 F (36.7 C) (Oral)   Wt 268 lb (121.6 kg)   BMI 41.97 kg/m   Current Meds  Medication Sig  . gabapentin (NEURONTIN) 300 MG capsule One tab PO qHS for a week, then BID for a week, then TID. May double weekly to a max of 3,600mg /day  . hydrOXYzine (ATARAX/VISTARIL) 50 MG tablet TAKE 1 TABLET (50 MG TOTAL) BY MOUTH EVERY 8 (EIGHT) HOURS AS NEEDED FOR ANXIETY.  Marland Kitchen levonorgestrel (MIRENA, 52 MG,) 20 MCG/24HR IUD Mirena 20 mcg/24 hours (6 yrs) 52 mg intrauterine device  Take 1 device by intrauterine route.  Marland Kitchen  meloxicam (MOBIC) 15 MG tablet One tab PO qAM with a meal for 2 weeks, then daily prn pain.  Marland Kitchen sertraline (ZOLOFT) 100 MG tablet Take 1 tablet (100 mg total) by mouth daily.  Marland Kitchen vortioxetine HBr (TRINTELLIX) 10 MG TABS tablet Take 1 tablet (10 mg total) by mouth daily.  . [DISCONTINUED] ALPRAZolam (XANAX) 0.5 MG tablet Take 0.5-1 tablets (0.25-0.5 mg total) by mouth 2 (two) times daily as needed for anxiety.    No results found for this or any previous visit (from the past 72 hour(s)).  No results found.     All questions at time of visit were answered - patient instructed to contact office with any additional concerns or updates.  ER/RTC precautions were reviewed with the patient as applicable.   Please note: voice recognition software was used to produce this document, and typos may escape review. Please contact Dr. Lyn Hollingshead for any needed clarifications.

## 2020-07-16 ENCOUNTER — Other Ambulatory Visit: Payer: Self-pay | Admitting: Sports Medicine

## 2020-07-16 DIAGNOSIS — M654 Radial styloid tenosynovitis [de Quervain]: Secondary | ICD-10-CM

## 2020-07-28 ENCOUNTER — Other Ambulatory Visit: Payer: Self-pay

## 2020-07-28 ENCOUNTER — Encounter: Payer: Self-pay | Admitting: Osteopathic Medicine

## 2020-07-28 ENCOUNTER — Ambulatory Visit: Payer: BC Managed Care – PPO | Admitting: Osteopathic Medicine

## 2020-07-28 VITALS — BP 148/93 | HR 79 | Temp 97.9°F | Wt 268.0 lb

## 2020-07-28 DIAGNOSIS — I10 Essential (primary) hypertension: Secondary | ICD-10-CM

## 2020-07-28 DIAGNOSIS — F411 Generalized anxiety disorder: Secondary | ICD-10-CM | POA: Diagnosis not present

## 2020-07-28 MED ORDER — VORTIOXETINE HBR 20 MG PO TABS
20.0000 mg | ORAL_TABLET | Freq: Every day | ORAL | 1 refills | Status: DC
Start: 2020-07-28 — End: 2021-01-24

## 2020-07-28 MED ORDER — WEGOVY 0.25 MG/0.5ML ~~LOC~~ SOAJ: 0.2500 mg | SUBCUTANEOUS | 0 refills | Status: DC

## 2020-07-28 NOTE — Progress Notes (Signed)
Stephanie Rhodes is a 55 y.o. female who presents to  [facility] Primary Care & Sports Medicine at [facility]  today, 07/28/20, seeking care for the following:  Follow up mental health - anxiety  Today 07/28/20: We were able to get started on the Trintellix 10 mg, pt doing better on this than Zoloft but still noting anxiety has some room for improvement. Has leftover Xanax, does not take this at work d/t drowsiness effects but finds anxiety at work is difficult to cope with.   Last visit 06/16/20: reports doing ok on current regimen Zoloft 100 mg daily and prn Xanax. Overall improved but still having significant anxiety issues. --> "Will increase Xanax from 0.5 mg to 1 mg to use few times per week. If that's helping, great! If not really helpful... Trintellix [vs] increase Zoloft to 150 mg" ? 04/27/20: reports increased anxietyand stress w/ work and family, has never had therapy/counseling and not interested in that at this point but would like to trial increasing Zoloft"Increase Zoloft to 100 mg. Xanax only as absolutely needed: 15 pills for at LEAST 30 days, ideally for 90 days"  ? 10/2019: "We had increased Lexapro from 10 to 20, and on subsequent visit added Wellbutrin to augment this as well as help with weight loss. Pt did not do well on the Wellbutrin and would like to switch this." We started Zoloft at that point  ? Previous mental health pharmacologic treatment:   Zoloft 25 mg - 100 mg  Lexapro 5 mg- 20 mg  Wellbutrin monotherapy (looks like 12 hour formulation was Rxfor daily use)   Trazodone 50 mg qhs   Clonazepam 0.5 daily prn   Gabapentin  Hydroxyzine  Follow up weight - stable since previous visit  Wt Readings from Last 3 Encounters:  07/28/20 268 lb (121.6 kg)  06/16/20 268 lb (121.6 kg)  04/27/20 264 lb (119.7 kg)   Follow up BP  BP Readings from Last 3 Encounters:  07/28/20 (!) 155/95 --> 148/93  06/16/20 (!) 145/92  04/27/20 126/83          ASSESSMENT & PLAN with other pertinent findings:  The primary encounter diagnosis was Generalized anxiety disorder. Diagnoses of Obesity, morbid, BMI 40.0-49.9 (HCC) and Hypertension, unspecified type were also pertinent to this visit.    Patient Instructions  Plan:  Increase Trintellix from 10 mg to 20 mg, can double up on the pills you have left   For weight, will start Wegovy low dose. Let me know if any issues/questions!      No orders of the defined types were placed in this encounter.   Meds ordered this encounter  Medications  . vortioxetine HBr (TRINTELLIX) 20 MG TABS tablet    Sig: Take 1 tablet (20 mg total) by mouth daily.    Dispense:  90 tablet    Refill:  1    Replaces 10 mg - please cancel that Rx. Please run w/ savings card  . Semaglutide-Weight Management (WEGOVY) 0.25 MG/0.5ML SOAJ    Sig: Inject 0.25 mg into the skin once a week for 4 doses.    Dispense:  2 mL    Refill:  0    Please disp needles w/ this. Please run w/ savings card. If PA needed: pt unable to take Phentermine, Wellbutrin, Qsymia or Contrave d/t psychiatric effects of these Rx. Morbid obesity w/ comorbid HTN which would benefit from weight reduction. Pt is on medically managed weight loss program w/ calorie restriction and activity increase  See below for relevant physical exam findings  See below for recent lab and imaging results reviewed  Medications, allergies, PMH, PSH, SocH, FamH reviewed below    Follow-up instructions: Return in about 6 weeks (around 09/08/2020) for RECHECK MENTAL HEALTH AND WEIGHT ON NEW MEDICATIONS (TRINTELLIX 20 MG, WEGOVY).                                        Exam:  BP (!) 148/93 (BP Location: Left Arm, Patient Position: Sitting, Cuff Size: Large)   Pulse 79   Temp 97.9 F (36.6 C) (Oral)   Wt 268 lb (121.6 kg)   BMI 41.97 kg/m   Constitutional: VS see above. General Appearance: alert,  well-developed, well-nourished, NAD  Neck: No masses, trachea midline.   Respiratory: Normal respiratory effort.   Musculoskeletal: Gait normal. Symmetric and independent movement of all extremities  Neurological: Normal balance/coordination. No tremor.  Skin: warm, dry, intact.   Psychiatric: Normal judgment/insight. Normal mood and affect. Oriented x3.   Current Meds  Medication Sig  . ALPRAZolam (XANAX) 1 MG tablet Take 1 tablet (1 mg total) by mouth 2 (two) times daily as needed for anxiety.  . gabapentin (NEURONTIN) 300 MG capsule One tab PO qHS for a week, then BID for a week, then TID. May double weekly to a max of 3,600mg /day  . hydrOXYzine (ATARAX/VISTARIL) 50 MG tablet TAKE 1 TABLET (50 MG TOTAL) BY MOUTH EVERY 8 (EIGHT) HOURS AS NEEDED FOR ANXIETY.  Marland Kitchen levonorgestrel (MIRENA, 52 MG,) 20 MCG/24HR IUD Mirena 20 mcg/24 hours (6 yrs) 52 mg intrauterine device  Take 1 device by intrauterine route.  . meloxicam (MOBIC) 15 MG tablet TAKE 1 TABLET BY MOUTH IN THE MORNING WITH A MEAL FOR 2 WEEKS THEN DAILY AS NEEDED FOR PAIN  . Semaglutide-Weight Management (WEGOVY) 0.25 MG/0.5ML SOAJ Inject 0.25 mg into the skin once a week for 4 doses.  . [DISCONTINUED] vortioxetine HBr (TRINTELLIX) 10 MG TABS tablet Take 1 tablet (10 mg total) by mouth daily.    No Known Allergies  Patient Active Problem List   Diagnosis Date Noted  . De Quervain's tenosynovitis, right 02/08/2020  . Radiculitis of right cervical region 09/14/2019    No family history on file.  Social History   Tobacco Use  Smoking Status Former Smoker  Smokeless Tobacco Never Used    Past Surgical History:  Procedure Laterality Date  . CESAREAN SECTION      Immunization History  Administered Date(s) Administered  . Influenza Split 02/14/2013  . Influenza, Seasonal, Injecte, Preservative Fre 03/07/2018  . Influenza,inj,Quad PF,6-35 Mos 02/14/2019  . Influenza,inj,quad, With Preservative 03/15/2016  .  Influenza-Unspecified 02/09/2017, 03/07/2018, 02/14/2019, 03/05/2020  . Moderna Sars-Covid-2 Vaccination 05/26/2019, 06/22/2019, 03/16/2020  . Tdap 05/14/2017  . Zoster Recombinat (Shingrix) 04/23/2020, 06/24/2020    No results found for this or any previous visit (from the past 2160 hour(s)).  No results found.     All questions at time of visit were answered - patient instructed to contact office with any additional concerns or updates. ER/RTC precautions were reviewed with the patient as applicable.   Please note: manual typing as well as voice recognition software may have been used to produce this document - typos may escape review. Please contact Dr. Lyn Hollingshead for any needed clarifications.

## 2020-07-28 NOTE — Patient Instructions (Signed)
Plan:  Increase Trintellix from 10 mg to 20 mg, can double up on the pills you have left   For weight, will start Wegovy low dose. Let me know if any issues/questions!

## 2020-07-29 ENCOUNTER — Ambulatory Visit: Payer: BC Managed Care – PPO | Admitting: Osteopathic Medicine

## 2020-07-29 DIAGNOSIS — Z13220 Encounter for screening for lipoid disorders: Secondary | ICD-10-CM | POA: Diagnosis not present

## 2020-07-29 DIAGNOSIS — Z0000 Encounter for general adult medical examination without abnormal findings: Secondary | ICD-10-CM | POA: Diagnosis not present

## 2020-07-29 DIAGNOSIS — Z6841 Body Mass Index (BMI) 40.0 and over, adult: Secondary | ICD-10-CM | POA: Diagnosis not present

## 2020-07-29 DIAGNOSIS — Z01419 Encounter for gynecological examination (general) (routine) without abnormal findings: Secondary | ICD-10-CM | POA: Diagnosis not present

## 2020-08-01 ENCOUNTER — Telehealth: Payer: Self-pay

## 2020-08-01 NOTE — Telephone Encounter (Signed)
Notification through Covermymeds that a PA was needed for patient's Wegovy 025mg /0.57mL.  PA completed and submitted; awaiting response.

## 2020-08-13 DIAGNOSIS — Z1231 Encounter for screening mammogram for malignant neoplasm of breast: Secondary | ICD-10-CM | POA: Diagnosis not present

## 2020-08-13 LAB — HM MAMMOGRAPHY

## 2020-08-16 ENCOUNTER — Encounter: Payer: Self-pay | Admitting: Osteopathic Medicine

## 2020-08-31 ENCOUNTER — Other Ambulatory Visit: Payer: Self-pay | Admitting: Osteopathic Medicine

## 2020-08-31 DIAGNOSIS — Z713 Dietary counseling and surveillance: Secondary | ICD-10-CM | POA: Diagnosis not present

## 2020-09-01 NOTE — Telephone Encounter (Signed)
Routing to covering provider.  °

## 2020-09-15 ENCOUNTER — Other Ambulatory Visit: Payer: Self-pay

## 2020-09-15 ENCOUNTER — Encounter: Payer: Self-pay | Admitting: Osteopathic Medicine

## 2020-09-15 ENCOUNTER — Ambulatory Visit: Payer: BC Managed Care – PPO | Admitting: Osteopathic Medicine

## 2020-09-15 VITALS — BP 142/87 | HR 70 | Temp 98.3°F | Wt 267.1 lb

## 2020-09-15 DIAGNOSIS — F411 Generalized anxiety disorder: Secondary | ICD-10-CM | POA: Diagnosis not present

## 2020-09-15 DIAGNOSIS — I10 Essential (primary) hypertension: Secondary | ICD-10-CM

## 2020-09-15 MED ORDER — WEGOVY 0.25 MG/0.5ML ~~LOC~~ SOAJ: 0.2500 mg | SUBCUTANEOUS | 0 refills | Status: AC

## 2020-09-15 MED ORDER — WEGOVY 1 MG/0.5ML ~~LOC~~ SOAJ: 1.0000 mg | SUBCUTANEOUS | 0 refills | Status: AC

## 2020-09-15 MED ORDER — WEGOVY 0.5 MG/0.5ML ~~LOC~~ SOAJ: 0.5000 mg | SUBCUTANEOUS | 0 refills | Status: DC

## 2020-09-15 NOTE — Progress Notes (Signed)
Stephanie Rhodes is a 55 y.o. female who presents to  [facility] at [facility]  today, 09/15/20, seeking care for the following:  Follow up mental health - anxiety Today 09/15/20: doing wel on the Trintellix 20 mg would like to stay on this Rx   Last visit 07/28/20: We were able to get started on the Trintellix 10 mg, pt doing better on this than Zoloft but still noting anxiety has some room for improvement. Has leftover Xanax, does not take this at work d/t drowsiness effects but finds anxiety at work is difficult to cope with. --> increased Trintellix to 20 mg   06/16/20: reports doing ok on current regimen Zoloft 100 mg daily and prn Xanax. Overall improved but still having significant anxiety issues. --> "Will increase Xanax from 0.5 mg to 1 mg to use few times per week. If that's helping, great! If not really helpful... Trintellix [vs] increase Zoloft to 150 mg" ? 04/27/20: reports increased anxietyand stress w/ work and family, has never had therapy/counseling and not interested in that at this point but would like to trial increasing Zoloft"Increase Zoloft to 100 mg. Xanax only as absolutely needed: 15 pills for at LEAST 30 days, ideally for 90 days"  ? 10/2019: "We had increased Lexapro from 10 to 20, and on subsequent visit added Wellbutrin to augment this as well as help with weight loss. Pt did not do well on the Wellbutrin and would like to switch this." We started Zoloft at that point  ? Previous mental health pharmacologic treatment:   Zoloft 25 mg - 100 mg  Lexapro 5 mg- 20 mg  Wellbutrin monotherapy (looks like 12 hour formulation was Rxfor daily use)   Trazodone 50 mg qhs   Clonazepam 0.5 daily prn   Gabapentin  Hydroxyzine  Follow up weight - stable since previous visit  Wt Readings:  09/15/20  267 lb --> conitnue Wegovy titrate up as directed   07/28/20 268 lb (121.6 kg) --> Rx [facility]  06/16/20 268 lb (121.6  kg)  04/27/20 264 lb (119.7 kg)   Follow up BP  BP Readings from Last 3 Encounters:  07/28/20 (!) 155/95 --> 148/93  06/16/20 (!) 145/92  04/27/20 126/83     Depression screen PHQ 2/9 09/15/2020 06/16/2020 04/27/2020  Decreased Interest 0 0 0  Down, Depressed, Hopeless 0 0 0  PHQ - 2 Score 0 0 0  Altered sleeping 1 3 2   Tired, decreased energy 1 1 1   Change in appetite 0 3 3  Feeling bad or failure about yourself  0 0 1  Trouble concentrating 0 0 0  Moving slowly or fidgety/restless 0 0 0  Suicidal thoughts 0 0 0  PHQ-9 Score 2 7 7   Difficult doing work/chores Not difficult at all Not difficult at all Somewhat difficult   GAD 7 : Generalized Anxiety Score 09/15/2020 06/16/2020 04/27/2020 11/10/2019  Nervous, Anxious, on Edge 1 1 1 1   Control/stop worrying 0 1 0 1  Worry too much - different things 1 1 2 1   Trouble relaxing 0 0 1 1  Restless 0 0 0 0  Easily annoyed or irritable 1 2 3 1   Afraid - awful might happen 0 0 0 0  Total GAD 7 Score 3 5 7 5   Anxiety Difficulty Not difficult at all Somewhat difficult Somewhat difficult Somewhat difficult        ASSESSMENT & PLAN with other pertinent findings:  The primary  encounter diagnosis was Generalized anxiety disorder. Diagnoses of Obesity, morbid, BMI 40.0-49.9 (HCC) and Hypertension, unspecified type were also pertinent to this visit.   Meds ordered this encounter  Medications  . Semaglutide-Weight Management (WEGOVY) 0.25 MG/0.5ML SOAJ    Sig: Inject 0.25 mg into the skin once a week for 4 doses.    Dispense:  2 mL    Refill:  0  . Semaglutide-Weight Management (WEGOVY) 0.5 MG/0.5ML SOAJ    Sig: Inject 0.5 mg into the skin once a week for 4 doses. Start after finishing 0.25 mg per week for 4 doses    Dispense:  2 mL    Refill:  0  . Semaglutide-Weight Management (WEGOVY) 1 MG/0.5ML SOAJ    Sig: Inject 1 mg into the skin once a week for 4 doses. Start after finishing 0.5 mg per week for 4 doses    Dispense:  2 mL     Refill:  0    There are no Patient Instructions on file for this visit.  No orders of the defined types were placed in this encounter.     See below for relevant physical exam findings  See below for recent lab and imaging results reviewed  Medications, allergies, PMH, PSH, SocH, FamH reviewed below    Follow-up instructions: Return in about 3 months (around 12/16/2020) for weight check, monitor mental health .                                        Exam:  BP (!) 142/87 (BP Location: Left Arm, Patient Position: Sitting, Cuff Size: Large)   Pulse 70   Temp 98.3 F (36.8 C) (Oral)   Wt 267 lb 1.3 oz (121.1 kg)   BMI 41.83 kg/m   Constitutional: VS see above. General Appearance: alert, well-developed, well-nourished, NAD  Neck: No masses, trachea midline.   Respiratory: Normal respiratory effort.   Musculoskeletal: Gait normal.  Neurological: Normal balance/coordination. No tremor.  Skin: warm, dry, intact.   Psychiatric: Normal judgment/insight. Normal mood and affect. Oriented x3.   Current Meds  Medication Sig  . ALPRAZolam (XANAX) 1 MG tablet Take 1 tablet (1 mg total) by mouth 2 (two) times daily as needed for anxiety.  . gabapentin (NEURONTIN) 300 MG capsule One tab PO qHS for a week, then BID for a week, then TID. May double weekly to a max of 3,600mg /day  . hydrOXYzine (ATARAX/VISTARIL) 50 MG tablet TAKE 1 TABLET (50 MG TOTAL) BY MOUTH EVERY 8 (EIGHT) HOURS AS NEEDED FOR ANXIETY.  Marland Kitchen levonorgestrel (MIRENA, 52 MG,) 20 MCG/24HR IUD Mirena 20 mcg/24 hours (6 yrs) 52 mg intrauterine device  Take 1 device by intrauterine route.  . meloxicam (MOBIC) 15 MG tablet TAKE 1 TABLET BY MOUTH IN THE MORNING WITH A MEAL FOR 2 WEEKS THEN DAILY AS NEEDED FOR PAIN  . Semaglutide-Weight Management (WEGOVY) 0.5 MG/0.5ML SOAJ Inject 0.5 mg into the skin once a week for 4 doses. Start after finishing 0.25 mg per week for 4 doses  .  Semaglutide-Weight Management (WEGOVY) 1 MG/0.5ML SOAJ Inject 1 mg into the skin once a week for 4 doses. Start after finishing 0.5 mg per week for 4 doses  . vortioxetine HBr (TRINTELLIX) 20 MG TABS tablet Take 1 tablet (20 mg total) by mouth daily.  . [DISCONTINUED] WEGOVY 0.25 MG/0.5ML SOAJ INJECT 0.25 MG INTO THE SKIN ONCE A WEEK FOR 4  DOSES.    No Known Allergies  Patient Active Problem List   Diagnosis Date Noted  . De Quervain's tenosynovitis, right 02/08/2020  . Radiculitis of right cervical region 09/14/2019    History reviewed. No pertinent family history.  Social History   Tobacco Use  Smoking Status Former Smoker  Smokeless Tobacco Never Used    Past Surgical History:  Procedure Laterality Date  . CESAREAN SECTION      Immunization History  Administered Date(s) Administered  . Influenza Split 02/14/2013  . Influenza, Seasonal, Injecte, Preservative Fre 03/07/2018  . Influenza,inj,Quad PF,6-35 Mos 02/14/2019  . Influenza,inj,quad, With Preservative 03/15/2016  . Influenza-Unspecified 02/09/2017, 03/07/2018, 02/14/2019, 03/05/2020  . Moderna Sars-Covid-2 Vaccination 05/26/2019, 06/22/2019, 03/16/2020  . Tdap 05/14/2017  . Zoster Recombinat (Shingrix) 04/23/2020, 06/24/2020    Recent Results (from the past 2160 hour(s))  HM MAMMOGRAPHY     Status: None   Collection Time: 08/13/20 12:00 AM  Result Value Ref Range   HM Mammogram 0-4 Bi-Rad 0-4 Bi-Rad, Self Reported Normal    No results found.     All questions at time of visit were answered - patient instructed to contact office with any additional concerns or updates. ER/RTC precautions were reviewed with the patient as applicable.   Please note: manual typing as well as voice recognition software may have been used to produce this document - typos may escape review. Please contact Dr. Lyn Hollingshead for any needed clarifications.

## 2020-09-21 ENCOUNTER — Other Ambulatory Visit: Payer: Self-pay | Admitting: Sports Medicine

## 2020-09-21 DIAGNOSIS — M5412 Radiculopathy, cervical region: Secondary | ICD-10-CM

## 2020-09-27 ENCOUNTER — Other Ambulatory Visit: Payer: Self-pay

## 2020-09-27 MED ORDER — ALPRAZOLAM 1 MG PO TABS: 1.0000 mg | ORAL_TABLET | Freq: Two times a day (BID) | ORAL | 0 refills | Status: DC | PRN

## 2020-09-27 NOTE — Telephone Encounter (Signed)
Pt left a vm msg on 09/26/20 requesting a med refill for xanax. Rx pended.

## 2020-10-31 ENCOUNTER — Other Ambulatory Visit: Payer: Self-pay | Admitting: Sports Medicine

## 2020-10-31 DIAGNOSIS — M654 Radial styloid tenosynovitis [de Quervain]: Secondary | ICD-10-CM

## 2020-12-06 ENCOUNTER — Telehealth: Payer: Self-pay

## 2020-12-06 ENCOUNTER — Encounter: Payer: Self-pay | Admitting: Osteopathic Medicine

## 2020-12-06 NOTE — Telephone Encounter (Signed)
Pt left a vm msg stating she was unable to get the [facility] rx due to cost. Requesting if provider can recommend a similar cheaper alternative. Pls advise, thanks.

## 2020-12-06 NOTE — Telephone Encounter (Signed)
See my chart message

## 2020-12-09 DIAGNOSIS — Z713 Dietary counseling and surveillance: Secondary | ICD-10-CM | POA: Diagnosis not present

## 2020-12-15 ENCOUNTER — Ambulatory Visit: Payer: BC Managed Care – PPO | Admitting: Osteopathic Medicine

## 2021-01-22 ENCOUNTER — Other Ambulatory Visit: Payer: Self-pay | Admitting: Osteopathic Medicine

## 2021-03-06 ENCOUNTER — Other Ambulatory Visit: Payer: Self-pay | Admitting: Medical-Surgical

## 2021-03-06 ENCOUNTER — Telehealth: Payer: Self-pay | Admitting: Medical-Surgical

## 2021-03-06 MED ORDER — ALPRAZOLAM 1 MG PO TABS: 1.0000 mg | ORAL_TABLET | Freq: Every day | ORAL | 0 refills | Status: DC | PRN

## 2021-03-06 NOTE — Telephone Encounter (Signed)
Joy   Patient called stating she was a patient of Natalie's and she needed her Xanax refilled I didn't think this was one you would refill without seeing patient so I got her on the schedule to transfer care on 04/12/21 but she will not have enough medicine until then.  She was asking if you could send 10 to the pharmacy to get her through the month she does not take them everyday. I told her all I could do was ask and she wanted to know if someone could let her know either way.   Arline Asp

## 2021-03-06 NOTE — Telephone Encounter (Signed)
Thank you I will let patient know. - CF

## 2021-03-13 DIAGNOSIS — Z23 Encounter for immunization: Secondary | ICD-10-CM | POA: Diagnosis not present

## 2021-04-12 ENCOUNTER — Encounter: Payer: Self-pay | Admitting: Medical-Surgical

## 2021-04-12 ENCOUNTER — Other Ambulatory Visit: Payer: Self-pay

## 2021-04-12 ENCOUNTER — Ambulatory Visit: Payer: BC Managed Care – PPO | Admitting: Medical-Surgical

## 2021-04-12 VITALS — BP 139/98 | HR 76 | Resp 20 | Ht 67.0 in | Wt 269.0 lb

## 2021-04-12 DIAGNOSIS — I10 Essential (primary) hypertension: Secondary | ICD-10-CM

## 2021-04-12 DIAGNOSIS — F411 Generalized anxiety disorder: Secondary | ICD-10-CM | POA: Diagnosis not present

## 2021-04-12 DIAGNOSIS — Z7689 Persons encountering health services in other specified circumstances: Secondary | ICD-10-CM | POA: Diagnosis not present

## 2021-04-12 MED ORDER — SEMAGLUTIDE(0.25 OR 0.5MG/DOS) 2 MG/1.5ML ~~LOC~~ SOPN: 0.2500 mg | PEN_INJECTOR | SUBCUTANEOUS | 0 refills | Status: DC

## 2021-04-12 MED ORDER — VORTIOXETINE HBR 20 MG PO TABS: 20.0000 mg | ORAL_TABLET | Freq: Every day | ORAL | 1 refills | Status: DC

## 2021-04-12 MED ORDER — ALPRAZOLAM 1 MG PO TABS: 1.0000 mg | ORAL_TABLET | Freq: Every day | ORAL | 1 refills | Status: DC | PRN

## 2021-04-12 NOTE — Progress Notes (Signed)
HPI with pertinent ROS:   CC: Transfer of care, general follow-up  HPI: Pleasant 55 year old female presenting today to transfer care to a new PCP and for the following:  Mood-has significant anxiety and stress but this seems to be more situational.  Reports that a coworker is very negative and this really causes a lot of stress for her.  She tries to stay quiet but unfortunately does not cope well with the negativity.  She is taking Trintellix 20 mg daily however she is not sure if this is working well for her or not.  She uses Xanax 1 mg on a as needed basis and reports she does not take this every day.  Unsure of the true frequency of this since she states that she only takes it once every 2 to 3 weeks but followed that up with the statement that a 30-day supply will last a couple of months.  She also uses hydroxyzine 50 mg at night to help with sleep.  This helps sometimes but is not extremely effective.  She has not tried increasing the dose of hydroxyzine so far.  Denies SI/HI and.  Using meloxicam daily as prescribed for shoulder and wrist pain.  Feels that this is helping very well.  Tolerating the medication without side effects.  Previously tried some weight loss medications, specifically Wegovy.  She did 4 weeks of this but then had difficulty getting the supply and then was unable to continue the process of slowly increasing the dose.  She is interested in getting restarted on that today.  I reviewed the past medical history, family history, social history, surgical history, and allergies today and no changes were needed.  Please see the problem list section below in epic for further details.   Physical exam:   General: Well Developed, well nourished, and in no acute distress.  Neuro: Alert and oriented x3, extra-ocular muscles intact, sensation grossly intact.  HEENT: Normocephalic, atraumatic, pupils equal round reactive to light, neck supple, no masses, no lymphadenopathy,  thyroid nonpalpable.  Skin: Warm and dry, no rashes. Cardiac: Regular rate and rhythm, no murmurs rubs or gallops, no lower extremity edema.  Respiratory: Clear to auscultation bilaterally. Not using accessory muscles, speaking in full sentences.  Impression and Recommendations:    1. Encounter to establish care Reviewed available information and discussed care concerns with patient.   2. Generalized anxiety disorder Continue Trintellix 20 mg daily as prescribed.  Continues very sparing use of Xanax.  Discussed the use of a benzodiazepine and the intention to use for severe anxiety versus panic attacks.  Should not be using this to sleep at night.  Discussed benzodiazepine dependence and tolerance and the risk of prolonged use of these medications.  For now, we will set the goal to use no more than 30 tablets every 90 days and avoid using this for sleep.  Okay to increase hydroxyzine up to 100 mg nightly as needed to see if this is more beneficial with sleep.  3. Hypertension, unspecified type Blood pressure significantly elevated on arrival however it was somewhat better on recheck.  Still elevated at 139/98.  Not currently taking any antihypertensives and does not regularly follow a low-sodium diet or monitor her blood pressure at home.  She does have access to equipment so I have asked her to monitor her blood pressure once a day over the next couple of weeks.  I will send her a MyChart message in a couple of weeks to see how her readings  at home have been.  If they are still elevated greater than 130/80, we will need to discuss medication until she is able to get some weight off.  4. Encounter for weight management Restarting semaglutide at 0.25 mg for the next 4 weeks.  Recommend dietary modification and starting a regular exercise program.  Return in about 4 weeks (around 05/10/2021) for weight check. ___________________________________________ Thayer Ohm, DNP, APRN, FNP-BC Primary  Care and Sports Medicine [facility] [place]

## 2021-04-13 ENCOUNTER — Telehealth: Payer: Self-pay

## 2021-04-13 NOTE — Telephone Encounter (Signed)
Pt needs PA for Semaglutide,0.25 or 0.5MG /DOS, 2 MG/1.5ML SOPN. Thanks.

## 2021-04-18 ENCOUNTER — Other Ambulatory Visit: Payer: Self-pay | Admitting: Medical-Surgical

## 2021-04-18 ENCOUNTER — Telehealth: Payer: Self-pay

## 2021-04-18 NOTE — Telephone Encounter (Signed)
Medication: Semaglutide,0.25 or 0.5MG /DOS, 2 MG/1.5ML SOPN Prior authorization submitted via CoverMyMeds on 04/18/2021 PA submission pending

## 2021-04-19 NOTE — Telephone Encounter (Signed)
Needs PA, thanks! 

## 2021-04-20 NOTE — Telephone Encounter (Signed)
Pt called to ask about PA status. Below is the current status:  "Please wait for [facility] to return a determination."  Left VM with PA status information and callback number if pt has questions or concerns.

## 2021-04-24 NOTE — Telephone Encounter (Signed)
Medication: Semaglutide,0.25 or 0.5MG /DOS, 2 MG/1.5ML SOPN Prior authorization determination received Medication has been denied Reason for denial:  "This medication is not covered when a member is able to lose weight while on a weight loss plan during a 6 month period (defined as a 1 pound per week weight loss through low caloris, increased activity, and other lifestyle changes). This medication is covered when a member has other weight-related issues (such as high blood pressure, high cholesterol, etc.) that require the immediate use of a medication to help with weight loss. "

## 2021-04-26 ENCOUNTER — Encounter: Payer: Self-pay | Admitting: Physician Assistant

## 2021-04-26 ENCOUNTER — Other Ambulatory Visit: Payer: Self-pay

## 2021-04-26 ENCOUNTER — Ambulatory Visit: Payer: BC Managed Care – PPO | Admitting: Physician Assistant

## 2021-04-26 VITALS — BP 160/101 | HR 75 | Ht 67.0 in | Wt 270.0 lb

## 2021-04-26 DIAGNOSIS — I10 Essential (primary) hypertension: Secondary | ICD-10-CM | POA: Diagnosis not present

## 2021-04-26 MED ORDER — LISINOPRIL-HYDROCHLOROTHIAZIDE 10-12.5 MG PO TABS
1.0000 | ORAL_TABLET | Freq: Every day | ORAL | 0 refills | Status: DC
Start: 2021-04-26 — End: 2021-05-18

## 2021-04-26 NOTE — Progress Notes (Signed)
° °  Subjective:    Patient ID: Stephanie Rhodes, female    DOB: Jan 03, 1966, 55 y.o.   MRN: [id number]  HPI Pt is a 55 yo obese female who presents to the clinic to follow up on blood pressure. She saw Stephanie Butter, NP and was told to start checking BP daily. She has been checking at home and getting readings in the 170s over 100's. No CP, palpitations, headaches, or vision changes.no family hx of heart disease or HTN. She has not started any new medications.   .. Active Ambulatory Problems    Diagnosis Date Noted   Radiculitis of right cervical region 09/14/2019   De Quervain's tenosynovitis, right 02/08/2020   Primary hypertension 04/26/2021   Resolved Ambulatory Problems    Diagnosis Date Noted   No Resolved Ambulatory Problems   Past Medical History:  Diagnosis Date   Depression      Review of Systems  All other systems reviewed and are negative.     Objective:   Physical Exam Vitals reviewed.  Constitutional:      Appearance: Normal appearance. She is obese.  HENT:     Head: Normocephalic.  Cardiovascular:     Rate and Rhythm: Normal rate and regular rhythm.     Pulses: Normal pulses.     Heart sounds: Normal heart sounds.  Pulmonary:     Effort: Pulmonary effort is normal.     Breath sounds: Normal breath sounds.  Musculoskeletal:     Right lower leg: No edema.     Left lower leg: No edema.  Lymphadenopathy:     Cervical: No cervical adenopathy.  Neurological:     General: No focal deficit present.     Mental Status: She is alert.  Psychiatric:        Mood and Affect: Mood normal.          Assessment & Plan:  Marland KitchenMarland KitchenPreeya was seen today for hypertension.  Diagnoses and all orders for this visit:  Primary hypertension -     COMPLETE METABOLIC PANEL WITH GFR -     Lipid Panel w/reflex Direct LDL -     TSH -     CBC -     lisinopril-hydrochlorothiazide (ZESTORETIC) 10-12.5 MG tablet; Take 1 tablet by mouth daily. -     EKG 12-Lead  BP continues to be  elevated at home readings and in the office this entire year.  EKG in office today. NSR no acute ST changes.  Repeat labs.  Start lisinopril/HCTZ daily. Discussed side effects.  Discussed diet changes and weight loss.  Follow up in 2 weeks with nurse visit.

## 2021-04-26 NOTE — Patient Instructions (Signed)

## 2021-04-29 ENCOUNTER — Other Ambulatory Visit: Payer: Self-pay | Admitting: Sports Medicine

## 2021-04-29 DIAGNOSIS — M654 Radial styloid tenosynovitis [de Quervain]: Secondary | ICD-10-CM

## 2021-05-03 ENCOUNTER — Telehealth: Payer: Self-pay

## 2021-05-03 NOTE — Telephone Encounter (Signed)
Would it be possible to do an appeal on the [facility] PA since pt is now taking lisinopril-hydrochlorothiazide? Thanks!

## 2021-05-10 NOTE — Progress Notes (Signed)
°  HPI with pertinent ROS:   CC: weight check, HTN follow up  HPI: Very pleasant 55 year old female presenting today for follow-up on hypertension and weight concerns.  She was seen a couple of weeks ago and started on lisinopril-hydrochlorothiazide 10-12.5 mg daily.  She has been taking this, tolerating well without side effects.  Checking her blood pressure at home with readings similar to her reading today 110s over 80s. Denies CP, SOB, palpitations, lower extremity edema, dizziness, headaches, or vision changes.  Unfortunately, insurance denied approval for Wegovy to help with weight loss.  Her denial did come back showing that it would be covered if she had an elevated BMI along with a more comorbid conditions such as hypertension.  Plan to file an appeal to see if we can get this pushed through now that she has documented primary hypertension.  I reviewed the past medical history, family history, social history, surgical history, and allergies today and no changes were needed.  Please see the problem list section below in epic for further details.   Physical exam:   General: Well Developed, well nourished, and in no acute distress.  Neuro: Alert and oriented x3.  HEENT: Normocephalic, atraumatic.  Skin: Warm and dry. Cardiac: Regular rate and rhythm, no murmurs rubs or gallops, no lower extremity edema.  Respiratory: Clear to auscultation bilaterally. Not using accessory muscles, speaking in full sentences.  Impression and Recommendations:    1. Primary hypertension Continue lisinopril-hydrochlorothiazide as prescribed.  Blood pressure at goal and looks beautiful today.  Continue checking blood pressure at home.  Low-sodium diet recommended.  2. Obesity, morbid, BMI 40.0-49.9 (HCC) Message sent to prior authorization staff to submit an appeal for [facility] coverage now that we have primary hypertension as a diagnosis along with morbid obesity.  Return in about 3 months (around  08/09/2021) for HTN follow up. ___________________________________________ Thayer Ohm, DNP, APRN, FNP-BC Primary Care and Sports Medicine [facility]

## 2021-05-11 ENCOUNTER — Other Ambulatory Visit: Payer: Self-pay

## 2021-05-11 ENCOUNTER — Encounter: Payer: Self-pay | Admitting: Medical-Surgical

## 2021-05-11 ENCOUNTER — Ambulatory Visit: Payer: BC Managed Care – PPO | Admitting: Medical-Surgical

## 2021-05-11 VITALS — BP 118/83 | HR 73 | Ht 67.0 in | Wt 265.0 lb

## 2021-05-11 DIAGNOSIS — I10 Essential (primary) hypertension: Secondary | ICD-10-CM | POA: Diagnosis not present

## 2021-05-12 LAB — LIPID PANEL W/REFLEX DIRECT LDL
Cholesterol: 176 mg/dL (ref <200)
HDL: 51 mg/dL (ref >=50)
LDL Cholesterol (Calc): 111 mg/dL (calc) — ABNORMAL HIGH
Non-HDL Cholesterol (Calc): 125 mg/dL (calc) (ref <130)
Total CHOL/HDL Ratio: 3.5 (calc) (ref <5.0)
Triglycerides: 49 mg/dL (ref <150)

## 2021-05-12 LAB — COMPLETE METABOLIC PANEL WITH GFR
AG Ratio: 1.8 (calc) (ref 1.0–2.5)
ALT: 17 U/L (ref 6–29)
AST: 20 U/L (ref 10–35)
Albumin: 4.2 g/dL (ref 3.6–5.1)
Alkaline phosphatase (APISO): 97 U/L (ref 37–153)
BUN/Creatinine Ratio: 28 (calc) — ABNORMAL HIGH (ref 6–22)
BUN: 29 mg/dL — ABNORMAL HIGH (ref 7–25)
CO2: 27 mmol/L (ref 20–32)
Calcium: 9 mg/dL (ref 8.6–10.4)
Chloride: 106 mmol/L (ref 98–110)
Creat: 1.03 mg/dL (ref 0.50–1.03)
Globulin: 2.3 g/dL (calc) (ref 1.9–3.7)
Glucose, Bld: 87 mg/dL (ref 65–99)
Potassium: 4.4 mmol/L (ref 3.5–5.3)
Sodium: 139 mmol/L (ref 135–146)
Total Bilirubin: 0.5 mg/dL (ref 0.2–1.2)
Total Protein: 6.5 g/dL (ref 6.1–8.1)
eGFR: 65 mL/min/{1.73_m2} (ref >=60)

## 2021-05-12 LAB — CBC
HCT: 40.6 % (ref 35.0–45.0)
Hemoglobin: 13.4 g/dL (ref 11.7–15.5)
MCH: 30.9 pg (ref 27.0–33.0)
MCHC: 33 g/dL (ref 32.0–36.0)
MCV: 93.8 fL (ref 80.0–100.0)
MPV: 11.1 fL (ref 7.5–12.5)
Platelets: 333 10*3/uL (ref 140–400)
RBC: 4.33 10*6/uL (ref 3.80–5.10)
RDW: 11.9 % (ref 11.0–15.0)
WBC: 5.8 10*3/uL (ref 3.8–10.8)

## 2021-05-12 LAB — TSH: TSH: 1.12 mIU/L

## 2021-05-12 NOTE — Progress Notes (Signed)
To PCP

## 2021-05-15 ENCOUNTER — Other Ambulatory Visit: Payer: Self-pay | Admitting: Osteopathic Medicine

## 2021-05-17 ENCOUNTER — Other Ambulatory Visit: Payer: Self-pay | Admitting: Physician Assistant

## 2021-05-17 DIAGNOSIS — I10 Essential (primary) hypertension: Secondary | ICD-10-CM

## 2021-05-18 DIAGNOSIS — Z713 Dietary counseling and surveillance: Secondary | ICD-10-CM | POA: Diagnosis not present

## 2021-05-20 ENCOUNTER — Telehealth: Payer: Self-pay

## 2021-05-20 NOTE — Telephone Encounter (Signed)
Medication: Semaglutide,0.25 or 0.5MG /DOS, 2 MG/1.5ML SOPN Prior authorization submitted via CoverMyMeds on 05/20/2021 PA submission pending

## 2021-05-24 NOTE — Telephone Encounter (Signed)
Medication: Semaglutide,0.25 or 0.5MG /DOS, 2 MG/1.5ML SOPN Prior authorization determination received Medication has been approved Approval dates: 05/20/2021-09/22/2021  Patient aware via: MyChart Pharmacy aware: Yes Provider aware via this encounter

## 2021-05-31 ENCOUNTER — Other Ambulatory Visit: Payer: Self-pay | Admitting: Sports Medicine

## 2021-05-31 DIAGNOSIS — M654 Radial styloid tenosynovitis [de Quervain]: Secondary | ICD-10-CM

## 2021-06-10 ENCOUNTER — Encounter: Payer: Self-pay | Admitting: Medical-Surgical

## 2021-06-12 ENCOUNTER — Other Ambulatory Visit: Payer: Self-pay

## 2021-06-12 DIAGNOSIS — I10 Essential (primary) hypertension: Secondary | ICD-10-CM

## 2021-06-12 MED ORDER — WEGOVY 0.5 MG/0.5ML ~~LOC~~ SOAJ: 0.5000 mg | SUBCUTANEOUS | 0 refills | Status: DC

## 2021-07-09 ENCOUNTER — Other Ambulatory Visit: Payer: Self-pay | Admitting: Medical-Surgical

## 2021-07-09 DIAGNOSIS — I10 Essential (primary) hypertension: Secondary | ICD-10-CM

## 2021-07-14 ENCOUNTER — Telehealth: Payer: Self-pay

## 2021-07-14 NOTE — Telephone Encounter (Signed)
Patient called to report that she needs a PA on her 83. She will be taking her last shot on Sunday and will need a new prescription and auth before next Sunday.  ?

## 2021-07-17 ENCOUNTER — Telehealth: Payer: Self-pay

## 2021-07-17 NOTE — Telephone Encounter (Addendum)
Initiated Prior authorization KNL:[id number] 0.5MG /0.5ML auto-injectors ?Via: Covermymeds ?Case/Key:[id number] ?Status: denied as of 07/17/21 ?Reason:weight loss medication is a plan exclusion from pt benefit plan. ?Notified Pt via: Mychart ?

## 2021-07-17 NOTE — Telephone Encounter (Signed)
Patient called wanting to know the status on her Pa for [facility] ?

## 2021-07-20 ENCOUNTER — Other Ambulatory Visit: Payer: Self-pay | Admitting: Medical-Surgical

## 2021-07-20 ENCOUNTER — Encounter: Payer: Self-pay | Admitting: Medical-Surgical

## 2021-07-20 MED ORDER — ALPRAZOLAM 1 MG PO TABS: 1.0000 mg | ORAL_TABLET | Freq: Every day | ORAL | 0 refills | Status: DC | PRN

## 2021-08-04 DIAGNOSIS — Z6841 Body Mass Index (BMI) 40.0 and over, adult: Secondary | ICD-10-CM | POA: Diagnosis not present

## 2021-08-04 DIAGNOSIS — Z01419 Encounter for gynecological examination (general) (routine) without abnormal findings: Secondary | ICD-10-CM | POA: Diagnosis not present

## 2021-08-04 DIAGNOSIS — Z0000 Encounter for general adult medical examination without abnormal findings: Secondary | ICD-10-CM | POA: Diagnosis not present

## 2021-08-04 DIAGNOSIS — Z13220 Encounter for screening for lipoid disorders: Secondary | ICD-10-CM | POA: Diagnosis not present

## 2021-08-08 ENCOUNTER — Telehealth: Payer: Self-pay

## 2021-08-08 ENCOUNTER — Ambulatory Visit: Payer: BC Managed Care – PPO | Admitting: Medical-Surgical

## 2021-08-08 NOTE — Telephone Encounter (Signed)
Patient called to let you know that her OBGYN is sending you her lab results to go over. ? ?FYI ?

## 2021-08-12 DIAGNOSIS — Z1212 Encounter for screening for malignant neoplasm of rectum: Secondary | ICD-10-CM | POA: Diagnosis not present

## 2021-08-12 DIAGNOSIS — Z1211 Encounter for screening for malignant neoplasm of colon: Secondary | ICD-10-CM | POA: Diagnosis not present

## 2021-08-12 LAB — COLOGUARD: Cologuard: NEGATIVE

## 2021-08-18 ENCOUNTER — Other Ambulatory Visit: Payer: Self-pay | Admitting: Medical-Surgical

## 2021-08-18 DIAGNOSIS — I10 Essential (primary) hypertension: Secondary | ICD-10-CM

## 2021-08-21 NOTE — Telephone Encounter (Signed)
Looks like PA said no weight loss meds are covered. Do you ever tell patients about [facility] Meds? $100 a month.  ?

## 2021-08-23 LAB — COLOGUARD: COLOGUARD: NEGATIVE

## 2021-08-24 ENCOUNTER — Telehealth: Payer: Self-pay

## 2021-08-24 NOTE — Telephone Encounter (Signed)
Resubmission Prior authorization for: ?Via: Covermymeds ?Case/Key:Wegovy 0.5MG /0.5ML auto-injectors ?Status: approved  as of 08/24/21 ?Reason:This approval has been back dated from 05/20/21-09/22/21 ?Notified Pt via: Mychart ?

## 2021-08-26 DIAGNOSIS — Z1231 Encounter for screening mammogram for malignant neoplasm of breast: Secondary | ICD-10-CM | POA: Diagnosis not present

## 2021-08-26 LAB — HM MAMMOGRAPHY

## 2021-08-30 ENCOUNTER — Other Ambulatory Visit: Payer: Self-pay | Admitting: Medical-Surgical

## 2021-08-30 DIAGNOSIS — I10 Essential (primary) hypertension: Secondary | ICD-10-CM

## 2021-08-30 MED ORDER — WEGOVY 0.25 MG/0.5ML ~~LOC~~ SOAJ: 0.2500 mg | SUBCUTANEOUS | 2 refills | Status: DC

## 2021-08-30 NOTE — Telephone Encounter (Signed)
Patient called today because the pharmacy told her that they are still waiting on the approval from the insurance. She is wanting a call to know what she needs to do since she received the message stating that it was approved. ? ?

## 2021-09-07 ENCOUNTER — Encounter: Payer: Self-pay | Admitting: Medical-Surgical

## 2021-09-07 NOTE — Telephone Encounter (Signed)
Patient still having issues, looks like you have spoke with her about this before?  ?

## 2021-09-13 ENCOUNTER — Telehealth: Payer: Self-pay

## 2021-09-13 MED ORDER — WEGOVY 1 MG/0.5ML ~~LOC~~ SOAJ: 1.0000 mg | SUBCUTANEOUS | 0 refills | Status: DC

## 2021-09-13 NOTE — Telephone Encounter (Signed)
Called [employer] [place] in regards to medications being stalled for about two weeks at pharmacy, Pt has recived a an approval for [employer] and is unable to fill medications, [facility] was [facility] under the ndc number  and then trying to fill the script for 0.5 mg when pt insurance allow for  a titration up to 1mg , spoke to pharmacy several times without resolution. ? ?Claim will pay out with no pa needed if pt is titrated um to 1 mg dose,  ? ?Pt has an active pa on file til 09/22/21 ?After 09/22/21 pt will need a new prior authorization for the next titration  dose  which will be 1.7  mg and then 2.4 mg ?

## 2021-09-13 NOTE — Telephone Encounter (Signed)
Prescription resent for Wegovy 1 mg weekly as requested. ? ?___________________________________________ ?Clearnce Sorrel, DNP, APRN, FNP-BC ?Primary Care and Sports Medicine ?[place] ? ?

## 2021-09-22 ENCOUNTER — Encounter: Payer: Self-pay | Admitting: Medical-Surgical

## 2021-09-26 ENCOUNTER — Telehealth: Payer: Self-pay

## 2021-09-26 NOTE — Telephone Encounter (Signed)
Patient left message on voicemail regarding Wegovy and her dose. Patient stated she is currently taking Wegovy 0.25mg  and would like to know if she should be on a increased dose and what is the next dose?  ?

## 2021-10-05 ENCOUNTER — Other Ambulatory Visit: Payer: Self-pay | Admitting: Sports Medicine

## 2021-10-05 ENCOUNTER — Other Ambulatory Visit: Payer: Self-pay | Admitting: Medical-Surgical

## 2021-10-05 ENCOUNTER — Other Ambulatory Visit: Payer: Self-pay

## 2021-10-05 DIAGNOSIS — M5412 Radiculopathy, cervical region: Secondary | ICD-10-CM

## 2021-10-05 NOTE — Progress Notes (Signed)
Sent patient a message to contact the office to schedule an appointment for increased medication.

## 2021-10-05 NOTE — Progress Notes (Unsigned)
Patient called needing a new Rx for the next dosage of 1.7

## 2021-10-06 ENCOUNTER — Other Ambulatory Visit: Payer: Self-pay | Admitting: Medical-Surgical

## 2021-10-10 NOTE — Telephone Encounter (Signed)
Patient called wanting to know the status on her [facility] refill PA.   Looks like [employer] was able to approve it but for the 1 mg and awaiting you response.

## 2021-10-10 NOTE — Telephone Encounter (Signed)
Patient's last OV 05/11/2021 was supposed to follow up 08/09/2021. No future appointments.

## 2021-10-12 ENCOUNTER — Other Ambulatory Visit: Payer: Self-pay | Admitting: Medical-Surgical

## 2021-10-12 DIAGNOSIS — I10 Essential (primary) hypertension: Secondary | ICD-10-CM

## 2021-10-12 DIAGNOSIS — M5412 Radiculopathy, cervical region: Secondary | ICD-10-CM

## 2021-10-16 ENCOUNTER — Telehealth: Payer: Self-pay | Admitting: Medical-Surgical

## 2021-10-16 ENCOUNTER — Other Ambulatory Visit: Payer: Self-pay | Admitting: Medical-Surgical

## 2021-10-16 MED ORDER — WEGOVY 1 MG/0.5ML ~~LOC~~ SOAJ: 1.0000 mg | SUBCUTANEOUS | 1 refills | Status: DC

## 2021-10-16 MED ORDER — GABAPENTIN 300 MG PO CAPS: ORAL_CAPSULE | ORAL | 1 refills | Status: DC

## 2021-10-16 NOTE — Telephone Encounter (Signed)
Pt has an appt scheduled for 7/14 but is requesting medication refill until appt for:  Lisinopril Wegovy Gabapentin

## 2021-10-16 NOTE — Telephone Encounter (Signed)
LVM informing pt of refills.  Stephanie Rhodes, CMA

## 2021-10-16 NOTE — Telephone Encounter (Signed)
Lisinopril-HCTZ, Gabapentin, and Wegovy sent to the pharmacy.

## 2021-11-24 ENCOUNTER — Ambulatory Visit: Payer: 59 | Admitting: Medical-Surgical

## 2021-11-24 VITALS — BP 126/86 | HR 85 | Ht 67.0 in | Wt 264.0 lb

## 2021-11-24 DIAGNOSIS — Z7689 Persons encountering health services in other specified circumstances: Secondary | ICD-10-CM

## 2021-11-24 DIAGNOSIS — F411 Generalized anxiety disorder: Secondary | ICD-10-CM | POA: Diagnosis not present

## 2021-11-24 MED ORDER — WEGOVY 1 MG/0.5ML ~~LOC~~ SOAJ: 1.0000 mg | SUBCUTANEOUS | 1 refills | Status: DC

## 2021-11-24 MED ORDER — ALPRAZOLAM 1 MG PO TABS
1.0000 mg | ORAL_TABLET | Freq: Every day | ORAL | 0 refills | Status: DC | PRN
Start: 2021-11-24 — End: 2022-05-15

## 2021-11-24 MED ORDER — BUSPIRONE HCL 15 MG PO TABS: 15.0000 mg | ORAL_TABLET | Freq: Two times a day (BID) | ORAL | 1 refills | Status: DC

## 2021-11-24 NOTE — Progress Notes (Signed)
Established Patient Office Visit  Subjective   Patient ID: Stephanie Rhodes, female   DOB: 10-28-1965 Age: 56 y.o. MRN: [id number]   Chief Complaint  Patient presents with   Medication Refill    Xanax refill phq gad done    HPI Pleasant 56 year old female presenting today for mood follow-up.  She has been struggling with anxiety.  She has tried multiple agents in the past and is now currently taking Trintellix 20 mg daily.  She has tried using hydroxyzine at bedtime to help with sleep however there does not seem to be a milligrams since she is usually evaluated without it or way too groggy in the morning.  She does still take Xanax 1 mg daily as needed and is aware that she should be using this very sparingly and never for sleep however she does admit that there are nights when its been a very long stressful day that she takes it just so she can quiet her mind to go to sleep.  Today, she notes that her mood issues have gotten worse over the past few months and she tends to be angry, and irritable more often than not lately.  Denies SI/HI.  Previously using Wegovy 1 mg weekly, tolerating well without side effects.  She did go off of it for a while as there was trouble getting the medication through insurance.  She is interested in trying to get restarted back on that today.   Objective:    Vitals:   11/24/21 1526  BP: 126/86  Pulse: 85  Height: 5\' 7"  (1.702 m)  Weight: 264 lb (119.7 kg)  SpO2: 99%  BMI (Calculated): 41.34   Physical Exam Vitals and nursing note reviewed.  Constitutional:      General: She is not in acute distress.    Appearance: Normal appearance. She is obese. She is not ill-appearing.  HENT:     Head: Normocephalic and atraumatic.  Cardiovascular:     Rate and Rhythm: Normal rate and regular rhythm.     Pulses: Normal pulses.     Heart sounds: Normal heart sounds.  Pulmonary:     Effort: Pulmonary effort is normal. No respiratory distress.     Breath sounds:  Normal breath sounds. No wheezing, rhonchi or rales.  Skin:    General: Skin is warm and dry.  Neurological:     Mental Status: She is alert and oriented to person, place, and time.  Psychiatric:        Mood and Affect: Mood normal.        Behavior: Behavior normal.        Thought Content: Thought content normal.        Judgment: Judgment normal.   No results found for this or any previous visit (from the past 24 hour(s)).     The 10-year ASCVD risk score (Arnett DK, et al., 2019) is: 2.2%   Values used to calculate the score:     Age: 42 years     Sex: Female     Is Non-Hispanic African American: No     Diabetic: No     Tobacco smoker: No     Systolic Blood Pressure: 126 mmHg     Is BP treated: Yes     HDL Cholesterol: 51 mg/dL     Total Cholesterol: 176 mg/dL   Assessment & Plan:   1. Generalized anxiety disorder Continue Trintellix 20 mg daily.  Discontinue hydroxyzine if not tolerable.  Adding BuSpar 15 mg twice  daily.  Advised to take this medication scheduled rather than as needed for best results but if needed to use on an as-needed basis, but will be fine.  Reviewed the use of Xanax and the goal for not using this anymore.  Strongly recommend she avoid using this for sleep due to risk of dependence and tolerance.  2. Encounter for weight management Discussed various options.  She really liked semaglutide so sending in 1 mg weekly to see if we can get this authorized by insurance.  Continue dietary modifications and work on regular intentional exercise.  Return in about 3 months (around 02/24/2022) for mood follow up.  ___________________________________________ Thayer Ohm, DNP, APRN, FNP-BC Primary Care and Sports Medicine [facility] [place]

## 2021-11-24 NOTE — Patient Instructions (Signed)
[facility] [phone]

## 2021-12-13 ENCOUNTER — Telehealth: Payer: Self-pay

## 2021-12-13 NOTE — Telephone Encounter (Signed)
Patient is having a problem getting her 15.

## 2021-12-14 ENCOUNTER — Telehealth: Payer: Self-pay

## 2021-12-14 NOTE — Telephone Encounter (Signed)
Prior Auth (EOC) ID: [id number] Drug/Service Name: Reginal Lutes 1 MG/0.5 ML PEN Patient: Stephanie Rhodes Date Requested: 12/14/2021 3:58:38 PM   MemberID: [id number] DOB: August 14, 1965

## 2021-12-17 ENCOUNTER — Other Ambulatory Visit: Payer: Self-pay | Admitting: Medical-Surgical

## 2021-12-18 ENCOUNTER — Encounter: Payer: Self-pay | Admitting: Medical-Surgical

## 2021-12-27 ENCOUNTER — Encounter: Payer: Self-pay | Admitting: Medical-Surgical

## 2021-12-31 ENCOUNTER — Other Ambulatory Visit: Payer: Self-pay | Admitting: Medical-Surgical

## 2022-01-15 ENCOUNTER — Other Ambulatory Visit: Payer: Self-pay | Admitting: Medical-Surgical

## 2022-01-15 DIAGNOSIS — I10 Essential (primary) hypertension: Secondary | ICD-10-CM

## 2022-01-30 ENCOUNTER — Encounter: Payer: Self-pay | Admitting: Medical-Surgical

## 2022-01-31 MED ORDER — PHENTERMINE HCL 15 MG PO CAPS: 15.0000 mg | ORAL_CAPSULE | ORAL | 0 refills | Status: DC

## 2022-01-31 NOTE — Addendum Note (Signed)
Addended bySamuel Bouche on: 01/31/2022 02:16 PM   Modules accepted: Orders

## 2022-02-27 ENCOUNTER — Encounter: Payer: Self-pay | Admitting: Medical-Surgical

## 2022-02-27 ENCOUNTER — Ambulatory Visit (INDEPENDENT_AMBULATORY_CARE_PROVIDER_SITE_OTHER): Payer: 59 | Admitting: Medical-Surgical

## 2022-02-27 VITALS — BP 105/70 | HR 85 | Resp 20 | Ht 67.0 in | Wt 258.7 lb

## 2022-02-27 DIAGNOSIS — Z7689 Persons encountering health services in other specified circumstances: Secondary | ICD-10-CM | POA: Diagnosis not present

## 2022-02-27 DIAGNOSIS — F411 Generalized anxiety disorder: Secondary | ICD-10-CM | POA: Diagnosis not present

## 2022-02-27 DIAGNOSIS — I10 Essential (primary) hypertension: Secondary | ICD-10-CM

## 2022-02-27 MED ORDER — AMITRIPTYLINE HCL 25 MG PO TABS: 25.0000 mg | ORAL_TABLET | Freq: Every day | ORAL | 0 refills | Status: DC

## 2022-02-27 MED ORDER — PHENTERMINE HCL 37.5 MG PO TABS: ORAL_TABLET | ORAL | 0 refills | Status: DC

## 2022-02-27 NOTE — Progress Notes (Signed)
Established Patient Office Visit  Subjective   Patient ID: Stephanie Rhodes, female   DOB: [birth year] Age: 56 y.o. MRN: [id number]   Chief Complaint  Patient presents with   Follow-up    Mood   HPI Pleasant 56 year old female presenting today for the following:  Mood: Taking Trintellix 20 mg daily, tolerating well without side effects.  Tried using hydroxyzine 50 mg every 8 hours as needed but this was not helpful for her anxiety.  Also tried taking 100 mg nightly to help with sleep however this was not beneficial either.  She has no difficulty falling asleep however she does wake several times nightly.  Also notes that she has periodic episodes where she feels anxiety during her workday as well as at home which presents as chest tightness with palpitations/heart racing.  Tried BuSpar 15 mg twice daily however she felt this was doing nothing for her and discontinue the medication.  History of using Xanax 1 mg on an as-needed basis.  Weight concerns: Has been using phentermine 15 mg daily, tolerating well without side effects.  No increase in anxiety with the medication.  Has not been exercising regularly although she does walk quite a bit of work.  Has noticed a small appetite as well as slightly more energy.  No constipation, dry mouth, dry eye, palpitations outside of anxiety, etc.  Interested in increasing the dose to 37.5 mg daily.   Objective:    Vitals:   02/27/22 1546  BP: 105/70  Pulse: 85  Resp: 20  Height: 5\' 7"  (1.702 m)  Weight: 258 lb 11.2 oz (117.3 kg)  SpO2: 98%  BMI (Calculated): 40.51    Physical Exam Vitals and nursing note reviewed.  Constitutional:      General: She is not in acute distress.    Appearance: Normal appearance. She is obese. She is not ill-appearing.  HENT:     Head: Normocephalic and atraumatic.  Cardiovascular:     Rate and Rhythm: Normal rate and regular rhythm.     Pulses: Normal pulses.     Heart sounds: Normal heart sounds.  Pulmonary:      Effort: Pulmonary effort is normal. No respiratory distress.     Breath sounds: Normal breath sounds. No wheezing, rhonchi or rales.  Skin:    General: Skin is warm and dry.  Neurological:     Mental Status: She is alert and oriented to person, place, and time.  Psychiatric:        Mood and Affect: Mood normal.        Behavior: Behavior normal.        Thought Content: Thought content normal.        Judgment: Judgment normal.   No results found for this or any previous visit (from the past 24 hour(s)).     The 10-year ASCVD risk score (Arnett DK, et al., 2019) is: 1.5%   Values used to calculate the score:     Age: 66 years     Sex: Female     Is Non-Hispanic African American: No     Diabetic: No     Tobacco smoker: No     Systolic Blood Pressure: 425 mmHg     Is BP treated: Yes     HDL Cholesterol: 51 mg/dL     Total Cholesterol: 176 mg/dL   Assessment & Plan:   1. Primary hypertension Blood pressure looks beautiful today.  Continue lisinopril-hydrochlorothiazide 10-12.5 mg daily.  2. Generalized anxiety disorder Continue Trintellix  20 mg daily.  Discontinue hydroxyzine and BuSpar as these were not effective.  Discussed various options for as needed use.  Suggested a small dose of propranolol 3 times daily as needed for anxiety however with her significant issues staying asleep at night, we will go ahead and start amitriptyline 25 mg nightly to see if this will help.  Advised this medication may cause excessive grogginess in the morning on the first few days of taking it however this should resolve on its own.  3. Encounter for weight management Discussed the importance of exercise and dietary modification with weight loss efforts.  Ultimately, she is going to have to make changes for a lifetime rather than just rarely.  For now we will go ahead and increase her phentermine to 37.5 mg daily.  Advised that we are only able to continue this for about 3 months before we have to  discontinue the medication.  Patient verbalized understanding and is agreeable to the plan.  Return in about 4 weeks (around 03/27/2022) for weight check/mood/insomnia follow up.  ___________________________________________ Thayer Ohm, DNP, APRN, FNP-BC Primary Care and Sports Medicine [facility] [place]

## 2022-03-24 ENCOUNTER — Other Ambulatory Visit: Payer: Self-pay | Admitting: Medical-Surgical

## 2022-03-27 ENCOUNTER — Ambulatory Visit: Payer: 59 | Admitting: Medical-Surgical

## 2022-04-08 ENCOUNTER — Other Ambulatory Visit: Payer: Self-pay | Admitting: Medical-Surgical

## 2022-04-14 ENCOUNTER — Other Ambulatory Visit: Payer: Self-pay | Admitting: Medical-Surgical

## 2022-04-14 DIAGNOSIS — M5412 Radiculopathy, cervical region: Secondary | ICD-10-CM

## 2022-04-16 ENCOUNTER — Telehealth: Payer: Self-pay

## 2022-04-16 NOTE — Telephone Encounter (Signed)
Patient called office to inform that she canceled her last appointment, but will be coming to her appointment on 05/15/2022! Thanks!

## 2022-04-16 NOTE — Telephone Encounter (Signed)
Patient has scheduled already for 01/0 @ 2:40 tvt

## 2022-04-17 ENCOUNTER — Other Ambulatory Visit: Payer: Self-pay | Admitting: Medical-Surgical

## 2022-04-17 DIAGNOSIS — I10 Essential (primary) hypertension: Secondary | ICD-10-CM

## 2022-05-06 ENCOUNTER — Other Ambulatory Visit: Payer: Self-pay | Admitting: Medical-Surgical

## 2022-05-15 ENCOUNTER — Encounter: Payer: Self-pay | Admitting: Medical-Surgical

## 2022-05-15 ENCOUNTER — Ambulatory Visit: Payer: 59 | Admitting: Medical-Surgical

## 2022-05-15 VITALS — BP 129/83 | HR 100 | Resp 20 | Ht 67.0 in | Wt 258.5 lb

## 2022-05-15 DIAGNOSIS — M5412 Radiculopathy, cervical region: Secondary | ICD-10-CM

## 2022-05-15 DIAGNOSIS — I10 Essential (primary) hypertension: Secondary | ICD-10-CM

## 2022-05-15 DIAGNOSIS — F411 Generalized anxiety disorder: Secondary | ICD-10-CM

## 2022-05-15 DIAGNOSIS — F5104 Psychophysiologic insomnia: Secondary | ICD-10-CM

## 2022-05-15 MED ORDER — ALPRAZOLAM 1 MG PO TABS: 1.0000 mg | ORAL_TABLET | Freq: Every day | ORAL | 0 refills | Status: DC | PRN

## 2022-05-15 MED ORDER — LISINOPRIL-HYDROCHLOROTHIAZIDE 10-12.5 MG PO TABS: 1.0000 | ORAL_TABLET | Freq: Every day | ORAL | 0 refills | Status: DC

## 2022-05-15 MED ORDER — VORTIOXETINE HBR 20 MG PO TABS: 20.0000 mg | ORAL_TABLET | Freq: Every day | ORAL | 1 refills | Status: DC

## 2022-05-15 MED ORDER — GABAPENTIN 300 MG PO CAPS: 300.0000 mg | ORAL_CAPSULE | Freq: Two times a day (BID) | ORAL | 1 refills | Status: DC

## 2022-05-15 MED ORDER — PHENTERMINE HCL 37.5 MG PO TABS: ORAL_TABLET | ORAL | 0 refills | Status: DC

## 2022-05-15 MED ORDER — AMITRIPTYLINE HCL 50 MG PO TABS: 50.0000 mg | ORAL_TABLET | Freq: Every day | ORAL | 1 refills | Status: DC

## 2022-05-15 MED ORDER — VORTIOXETINE HBR 20 MG PO TABS: 20.0000 mg | ORAL_TABLET | Freq: Every day | ORAL | 0 refills | Status: DC

## 2022-05-15 MED ORDER — AMITRIPTYLINE HCL 50 MG PO TABS: 25.0000 mg | ORAL_TABLET | Freq: Every day | ORAL | 1 refills | Status: DC

## 2022-05-15 NOTE — Progress Notes (Signed)
Established Patient Office Visit  Subjective   Patient ID: Stephanie Rhodes, female   DOB: [birth year] Age: 57 y.o. MRN: [id number]   Chief Complaint  Patient presents with   Weight Check   Follow-up    Mood   Insomnia   HPI Pleasant 57 year old female presenting today for the following:  Blood pressure: Taking lisinopril-hydrochlorothiazide 10-12.5 mg daily, tolerating well without side effects.  Trying to limit dietary sodium.  No regular intentional exercise. Denies CP, SOB, palpitations, lower extremity edema, dizziness, headaches, or vision changes.  Cervical radiculitis: Taking gabapentin 300 mg every morning, tolerating well without side effects.  Feels the medication is working well for her and does not cause excessive sedation.  Insomnia: Taking amitriptyline 25 mg nightly, tolerating well.  Unfortunately, this medication is not working very well to help keep her asleep.  She is falling asleep without difficulty now but wakes several times nightly and has difficulty getting back to sleep.  Mood: Taking Trintellix 20 mg daily, tolerating well without side effects.  Using alprazolam 1 mg very sparingly and notes that 30 tablets last her greater than 90 days.  Denies SI/HI.  Weight concerns: Used phentermine for approximately 1 month at the full dose, tolerated well without side effects.  Unfortunately, she is not exercising and has room for improvement in her dietary habits.   Objective:    Vitals:   05/15/22 1444  BP: 129/83  Pulse: 100  Resp: 20  Height: 5\' 7"  (1.702 m)  Weight: 258 lb 8 oz (117.3 kg)  SpO2: 98%  BMI (Calculated): 40.48   Physical Exam Vitals and nursing note reviewed.  Constitutional:      General: She is not in acute distress.    Appearance: Normal appearance. She is not ill-appearing.  HENT:     Head: Normocephalic and atraumatic.  Cardiovascular:     Rate and Rhythm: Normal rate and regular rhythm.     Pulses: Normal pulses.     Heart  sounds: Normal heart sounds.  Pulmonary:     Effort: Pulmonary effort is normal. No respiratory distress.     Breath sounds: Normal breath sounds. No wheezing, rhonchi or rales.  Skin:    General: Skin is warm and dry.  Neurological:     Mental Status: She is alert and oriented to person, place, and time.  Psychiatric:        Mood and Affect: Mood normal.        Behavior: Behavior normal.        Thought Content: Thought content normal.        Judgment: Judgment normal.   No results found for this or any previous visit (from the past 24 hour(s)).     The 10-year ASCVD risk score (Arnett DK, et al., 2019) is: 2.3%   Values used to calculate the score:     Age: 81 years     Sex: Female     Is Non-Hispanic African American: No     Diabetic: No     Tobacco smoker: No     Systolic Blood Pressure: 409 mmHg     Is BP treated: Yes     HDL Cholesterol: 51 mg/dL     Total Cholesterol: 176 mg/dL   Assessment & Plan:   1. Primary hypertension Checking labs as below.  Blood pressure at goal.  Continue lisinopril-HCTZ 10-12.5 mg daily.  Monitor blood pressure at home with a goal of 130/80 or less.  Recommend low-sodium diet, regular intentional  exercise and weight loss to healthy weight.  Continue gabapentin 300 mg twice daily as needed. - lisinopril-hydrochlorothiazide (ZESTORETIC) 10-12.5 MG tablet; Take 1 tablet by mouth daily.  Dispense: 90 tablet; Refill: 0 - CBC with Differential/Platelet - COMPLETE METABOLIC PANEL WITH GFR - Lipid panel  2. Radiculitis of right cervical region Continue gabapentin 300 mg twice daily as needed. - gabapentin (NEURONTIN) 300 MG capsule; Take 1 capsule (300 mg total) by mouth 2 (two) times daily.  Dispense: 180 capsule; Refill: 1  3. Generalized anxiety disorder Continue Trintellix 20 mg daily.  Would recommend limiting to the next 2 severe anxiety only to prevent tolerance and dependence. - vortioxetine HBr (TRINTELLIX) 20 MG TABS tablet; Take 1  tablet (20 mg total) by mouth daily.  Dispense: 30 tablet; Refill: 0 - ALPRAZolam (XANAX) 1 MG tablet; Take 1 tablet (1 mg total) by mouth daily as needed for anxiety. 30 tablets must last 90 days  Dispense: 30 tablet; Refill: 0  4. Psychophysiological insomnia Increasing amitriptyline to 50 mg nightly. - amitriptyline (ELAVIL) 50 MG tablet; Take 0.5 tablets (25 mg total) by mouth at bedtime.  Dispense: 90 tablet; Refill: 1  5. Morbid obesity (HCC) Checking hemoglobin A1c.  Restart phentermine 37.5 mg daily.  Recommend completing 1 month of this and if desired to continue, return for a follow-up appointment to gauge her response and tolerance to the medication.  Discussed the importance of regular intentional exercise and forming new habits.  Also discussed limiting snacking and grazing while working on portion sizes and healthier options. - phentermine (ADIPEX-P) 37.5 MG tablet; One tab by mouth qAM  Dispense: 30 tablet; Refill: 0 - Hemoglobin A1c  Return in about 6 months (around 11/13/2022) for chronic disease follow up.  ___________________________________________ Clearnce Sorrel, DNP, APRN, FNP-BC Primary Care and [place]

## 2022-05-16 LAB — CBC WITH DIFFERENTIAL/PLATELET
Absolute Monocytes: 643 cells/uL (ref 200–950)
Basophils Absolute: 48 cells/uL (ref 0–200)
Basophils Relative: 0.5 %
Eosinophils Absolute: 77 cells/uL (ref 15–500)
Eosinophils Relative: 0.8 %
HCT: 37.8 % (ref 35.0–45.0)
Hemoglobin: 13 g/dL (ref 11.7–15.5)
Lymphs Abs: 2477 cells/uL (ref 850–3900)
MCH: 31.6 pg (ref 27.0–33.0)
MCHC: 34.4 g/dL (ref 32.0–36.0)
MCV: 92 fL (ref 80.0–100.0)
MPV: 10.6 fL (ref 7.5–12.5)
Monocytes Relative: 6.7 %
Neutro Abs: 6355 cells/uL (ref 1500–7800)
Neutrophils Relative %: 66.2 %
Platelets: 366 10*3/uL (ref 140–400)
RBC: 4.11 10*6/uL (ref 3.80–5.10)
RDW: 12.8 % (ref 11.0–15.0)
Total Lymphocyte: 25.8 %
WBC: 9.6 10*3/uL (ref 3.8–10.8)

## 2022-05-16 LAB — COMPLETE METABOLIC PANEL WITH GFR
AG Ratio: 1.8 (calc) (ref 1.0–2.5)
ALT: 24 U/L (ref 6–29)
AST: 26 U/L (ref 10–35)
Albumin: 4.4 g/dL (ref 3.6–5.1)
Alkaline phosphatase (APISO): 108 U/L (ref 37–153)
BUN: 18 mg/dL (ref 7–25)
CO2: 25 mmol/L (ref 20–32)
Calcium: 9.5 mg/dL (ref 8.6–10.4)
Chloride: 102 mmol/L (ref 98–110)
Creat: 0.88 mg/dL (ref 0.50–1.03)
Globulin: 2.5 g/dL (calc) (ref 1.9–3.7)
Glucose, Bld: 85 mg/dL (ref 65–99)
Potassium: 4.3 mmol/L (ref 3.5–5.3)
Sodium: 137 mmol/L (ref 135–146)
Total Bilirubin: 0.5 mg/dL (ref 0.2–1.2)
Total Protein: 6.9 g/dL (ref 6.1–8.1)
eGFR: 78 mL/min/{1.73_m2} (ref >=60)

## 2022-05-16 LAB — LIPID PANEL
Cholesterol: 194 mg/dL (ref <200)
HDL: 54 mg/dL (ref >=50)
LDL Cholesterol (Calc): 115 mg/dL (calc) — ABNORMAL HIGH
Non-HDL Cholesterol (Calc): 140 mg/dL (calc) — ABNORMAL HIGH (ref <130)
Total CHOL/HDL Ratio: 3.6 (calc) (ref <5.0)
Triglycerides: 135 mg/dL (ref <150)

## 2022-05-16 LAB — HEMOGLOBIN A1C
Hgb A1c MFr Bld: 5.5 % of total Hgb (ref <5.7)
Mean Plasma Glucose: 111 mg/dL
eAG (mmol/L): 6.2 mmol/L

## 2022-06-10 ENCOUNTER — Other Ambulatory Visit: Payer: Self-pay | Admitting: Medical-Surgical

## 2022-06-11 NOTE — Telephone Encounter (Signed)
Needs appointment for weight check if she wants to continue Phentermine.

## 2022-06-27 ENCOUNTER — Encounter: Payer: Self-pay | Admitting: Medical-Surgical

## 2022-06-29 ENCOUNTER — Encounter: Payer: Self-pay | Admitting: Medical-Surgical

## 2022-08-15 ENCOUNTER — Other Ambulatory Visit: Payer: Self-pay | Admitting: Medical-Surgical

## 2022-08-15 DIAGNOSIS — F5104 Psychophysiologic insomnia: Secondary | ICD-10-CM

## 2022-08-17 ENCOUNTER — Encounter: Payer: 59 | Admitting: Medical-Surgical

## 2022-08-24 ENCOUNTER — Encounter: Payer: Self-pay | Admitting: Medical-Surgical

## 2022-08-24 ENCOUNTER — Ambulatory Visit (INDEPENDENT_AMBULATORY_CARE_PROVIDER_SITE_OTHER): Payer: 59 | Admitting: Medical-Surgical

## 2022-08-24 VITALS — BP 111/73 | HR 74 | Resp 20 | Ht 67.0 in | Wt 255.6 lb

## 2022-08-24 DIAGNOSIS — Z0000 Encounter for general adult medical examination without abnormal findings: Secondary | ICD-10-CM

## 2022-08-24 DIAGNOSIS — Z1329 Encounter for screening for other suspected endocrine disorder: Secondary | ICD-10-CM

## 2022-08-24 DIAGNOSIS — F5104 Psychophysiologic insomnia: Secondary | ICD-10-CM | POA: Diagnosis not present

## 2022-08-24 DIAGNOSIS — Z13220 Encounter for screening for lipoid disorders: Secondary | ICD-10-CM

## 2022-08-24 DIAGNOSIS — I10 Essential (primary) hypertension: Secondary | ICD-10-CM

## 2022-08-24 DIAGNOSIS — N951 Menopausal and female climacteric states: Secondary | ICD-10-CM

## 2022-08-24 DIAGNOSIS — Z131 Encounter for screening for diabetes mellitus: Secondary | ICD-10-CM

## 2022-08-24 MED ORDER — ESTRADIOL 0.0375 MG/24HR TD PTWK: 0.0375 mg | MEDICATED_PATCH | TRANSDERMAL | 1 refills | Status: DC

## 2022-08-24 MED ORDER — AMITRIPTYLINE HCL 50 MG PO TABS: 50.0000 mg | ORAL_TABLET | Freq: Every day | ORAL | 1 refills | Status: DC

## 2022-08-24 MED ORDER — LISINOPRIL-HYDROCHLOROTHIAZIDE 10-12.5 MG PO TABS
1.0000 | ORAL_TABLET | Freq: Every day | ORAL | 0 refills | Status: DC
Start: 2022-08-24 — End: 2023-02-07

## 2022-08-24 NOTE — Progress Notes (Signed)
Complete physical exam  Patient: Stephanie Rhodes   DOB: 12-19-1965   57 y.o. Female  MRN: [id number]  Subjective:    Chief Complaint  Patient presents with   Annual Exam   Stephanie Rhodes is a 57 y.o. female who presents today for a complete physical exam. She reports consuming a general diet. The patient does not participate in regular exercise at present. She generally feels well. She reports sleeping fairly well. She does not have additional problems to discuss today.    Most recent fall risk assessment:    08/24/2022    9:26 AM  Fall Risk   Falls in the past year? 0  Number falls in past yr: 0  Injury with Fall? 0  Risk for fall due to : No Fall Risks  Follow up Falls evaluation completed     Most recent depression screenings:    08/24/2022    9:26 AM 05/15/2022    2:46 PM  PHQ 2/9 Scores  PHQ - 2 Score 0 0    Vision:Not within last year , Dental: No current dental problems and Receives regular dental care, and STD: The patient denies history of sexually transmitted disease.    Patient Care Team: Christen Butter, NP as PCP - General (Nurse Practitioner)   Outpatient Medications Prior to Visit  Medication Sig   ALPRAZolam (XANAX) 1 MG tablet Take 1 tablet (1 mg total) by mouth daily as needed for anxiety. 30 tablets must last 90 days   gabapentin (NEURONTIN) 300 MG capsule Take 1 capsule (300 mg total) by mouth 2 (two) times daily.   levonorgestrel (MIRENA, 52 MG,) 20 MCG/24HR IUD Mirena 20 mcg/24 hours (6 yrs) 52 mg intrauterine device  Take 1 device by intrauterine route.   vortioxetine HBr (TRINTELLIX) 20 MG TABS tablet Take 1 tablet (20 mg total) by mouth daily.   [DISCONTINUED] amitriptyline (ELAVIL) 50 MG tablet Take 1 tablet (50 mg total) by mouth at bedtime.   [DISCONTINUED] estradiol (CLIMARA - DOSED IN MG/24 HR) 0.05 mg/24hr patch Place 0.05 mg onto the skin once a week.   [DISCONTINUED] lisinopril-hydrochlorothiazide (ZESTORETIC) 10-12.5 MG tablet Take 1  tablet by mouth daily.   [DISCONTINUED] phentermine (ADIPEX-P) 37.5 MG tablet One tab by mouth qAM   No facility-administered medications prior to visit.   Review of Systems  Constitutional:  Positive for diaphoresis (profused sweating). Negative for chills, fever, malaise/fatigue and weight loss.  HENT:  Negative for congestion, ear pain, hearing loss, sinus pain and sore throat.   Eyes:  Negative for blurred vision, photophobia and pain.  Respiratory:  Negative for cough, shortness of breath and wheezing.   Cardiovascular:  Negative for chest pain, palpitations and leg swelling.  Gastrointestinal:  Negative for abdominal pain, constipation, diarrhea, heartburn, nausea and vomiting.  Genitourinary:  Negative for dysuria, frequency and urgency.  Musculoskeletal:  Negative for falls and neck pain.  Skin:  Negative for itching and rash.  Neurological:  Negative for dizziness, weakness and headaches.  Endo/Heme/Allergies:  Negative for polydipsia. Does not bruise/bleed easily.  Psychiatric/Behavioral:  Negative for depression, substance abuse and suicidal ideas. The patient is not nervous/anxious.      Objective:    BP 111/73 (BP Location: Right Arm, Cuff Size: Large)   Pulse 74   Resp 20   Ht  (1.702 m)   Wt 255 lb 9.6 oz (115.9 kg)   SpO2 100%   BMI 40.03 kg/m    Physical Exam Constitutional:  General: She is not in acute distress.    Appearance: Normal appearance. She is obese. She is not ill-appearing.  HENT:     Head: Normocephalic and atraumatic.     Right Ear: Tympanic membrane, ear canal and external ear normal. There is no impacted cerumen.     Left Ear: Tympanic membrane, ear canal and external ear normal. There is no impacted cerumen.     Nose: Nose normal. No congestion or rhinorrhea.     Mouth/Throat:     Mouth: Mucous membranes are moist.     Pharynx: No oropharyngeal exudate or posterior oropharyngeal erythema.  Eyes:     General: No scleral icterus.        Right eye: No discharge.        Left eye: No discharge.     Extraocular Movements: Extraocular movements intact.     Conjunctiva/sclera: Conjunctivae normal.     Pupils: Pupils are equal, round, and reactive to light.  Neck:     Thyroid: No thyromegaly.     Vascular: No carotid bruit or JVD.     Trachea: Trachea normal.  Cardiovascular:     Rate and Rhythm: Normal rate and regular rhythm.     Pulses: Normal pulses.     Heart sounds: Normal heart sounds. No murmur heard.    No friction rub. No gallop.  Pulmonary:     Effort: Pulmonary effort is normal. No respiratory distress.     Breath sounds: Normal breath sounds. No wheezing.  Abdominal:     General: Bowel sounds are normal. There is no distension.     Palpations: Abdomen is soft.     Tenderness: There is no abdominal tenderness. There is no guarding.  Musculoskeletal:        General: Normal range of motion.     Cervical back: Normal range of motion and neck supple.  Lymphadenopathy:     Cervical: No cervical adenopathy.  Skin:    General: Skin is warm and dry.  Neurological:     Mental Status: She is alert and oriented to person, place, and time.     Cranial Nerves: No cranial nerve deficit.  Psychiatric:        Mood and Affect: Mood normal.        Behavior: Behavior normal.        Thought Content: Thought content normal.        Judgment: Judgment normal.   No results found for any visits on 08/24/22.     Assessment & Plan:    Routine Health Maintenance and Physical Exam  Immunization History  Administered Date(s) Administered   Influenza Inj Mdck Quad Pf 03/15/2016   Influenza Split 02/14/2013   Influenza, Seasonal, Injecte, Preservative Fre 03/07/2018   Influenza,inj,Quad PF,6+ Mos 03/13/2021, 02/16/2022   Influenza,inj,Quad PF,6-35 Mos 02/14/2019   Influenza,inj,quad, With Preservative 03/15/2016   Influenza-Unspecified 03/15/2016, 02/09/2017, 03/07/2018, 02/14/2019, 03/05/2020   Moderna Sars-Covid-2  Vaccination 05/26/2019, 06/22/2019, 03/16/2020   Tdap 05/14/2017   Zoster Recombinat (Shingrix) 04/23/2020, 06/24/2020    Health Maintenance  Topic Date Due   PAP SMEAR-Modifier  Never done   INFLUENZA VACCINE  12/13/2022   MAMMOGRAM  08/27/2023   DTaP/Tdap/Td (2 - Td or Tdap) 05/15/2027   COLONOSCOPY (Pts 45-58yrs Insurance coverage will need to be confirmed)  08/13/2031   Zoster Vaccines- Shingrix  Completed   HPV VACCINES  Aged Out   COVID-19 Vaccine  Discontinued   Hepatitis C Screening  Discontinued   HIV Screening  Discontinued    Discussed health benefits of physical activity, and encouraged her to engage in regular exercise appropriate for her age and condition.  1. Primary hypertension Blood pressure at goal.  Updating labs today.  Continue lisinopril-HCTZ as prescribed. - Lipid panel - COMPLETE METABOLIC PANEL WITH GFR - CBC with Differential/Platelet - lisinopril-hydrochlorothiazide (ZESTORETIC) 10-12.5 MG tablet; Take 1 tablet by mouth daily.  Dispense: 90 tablet; Refill: 0  2. Psychophysiological insomnia Continue amitriptyline 50 mg nightly.  Printed good Rx coupon for the cheapest price since her insurance coverage is a little too expensive to get as a 90-day supply. - amitriptyline (ELAVIL) 50 MG tablet; Take 1 tablet (50 mg total) by mouth at bedtime.  Dispense: 90 tablet; Refill: 1  3. Annual physical exam Checking labs.  Up-to-date on preventative care however would recommend updating eye exam this is financially feasible.  Wellness information provided with AVS. - Lipid panel - COMPLETE METABOLIC PANEL WITH GFR - CBC with Differential/Platelet  4. Lipid screening Checking lipids today.  5. Diabetes mellitus screening Checking hemoglobin A1c. - Hemoglobin A1c  6. Thyroid disorder screen Checking TSH. - TSH  7.  Menopausal symptoms Under the care of of OB/GYN until recently when her insurance will no longer cover her provider.  Has a Mirena IUD in  place and is using Climara 0.05 mg patches once weekly.  Has heard a lot about the risks when using estradiol patches and is interested in tapering off these.  Reports significant issues with daytime sweating.  No night sweats but does have some hot flashes periodically.  Reducing Climara to 0.0375 mg once weekly for 1 to 2 months and then evaluate tolerance of reduction and plan to reduce further if doing well.  Reviewed options for Allyne Gee if this is covered by her insurance as it will help with the vasomotor symptoms without interfering with her current mood management.  Drug information provided with AVS for her review.  She will let me know if she like to try this.  Return in about 6 months (around 02/23/2023) for HTN follow up.     Christen Butter, NP

## 2022-08-25 LAB — CBC WITH DIFFERENTIAL/PLATELET
Absolute Monocytes: 399 cells/uL (ref 200–950)
Basophils Absolute: 51 cells/uL (ref 0–200)
Basophils Relative: 0.9 %
Eosinophils Absolute: 91 cells/uL (ref 15–500)
Eosinophils Relative: 1.6 %
HCT: 40 % (ref 35.0–45.0)
Hemoglobin: 13.3 g/dL (ref 11.7–15.5)
Lymphs Abs: 1682 cells/uL (ref 850–3900)
MCH: 31.3 pg (ref 27.0–33.0)
MCHC: 33.3 g/dL (ref 32.0–36.0)
MCV: 94.1 fL (ref 80.0–100.0)
MPV: 10.3 fL (ref 7.5–12.5)
Monocytes Relative: 7 %
Neutro Abs: 3477 cells/uL (ref 1500–7800)
Neutrophils Relative %: 61 %
Platelets: 365 10*3/uL (ref 140–400)
RBC: 4.25 10*6/uL (ref 3.80–5.10)
RDW: 11.9 % (ref 11.0–15.0)
Total Lymphocyte: 29.5 %
WBC: 5.7 10*3/uL (ref 3.8–10.8)

## 2022-08-25 LAB — COMPLETE METABOLIC PANEL WITH GFR
AG Ratio: 1.6 (calc) (ref 1.0–2.5)
ALT: 18 U/L (ref 6–29)
AST: 22 U/L (ref 10–35)
Albumin: 4.1 g/dL (ref 3.6–5.1)
Alkaline phosphatase (APISO): 80 U/L (ref 37–153)
BUN: 17 mg/dL (ref 7–25)
CO2: 26 mmol/L (ref 20–32)
Calcium: 9.1 mg/dL (ref 8.6–10.4)
Chloride: 108 mmol/L (ref 98–110)
Creat: 0.88 mg/dL (ref 0.50–1.03)
Globulin: 2.6 g/dL (calc) (ref 1.9–3.7)
Glucose, Bld: 93 mg/dL (ref 65–99)
Potassium: 5.1 mmol/L (ref 3.5–5.3)
Sodium: 141 mmol/L (ref 135–146)
Total Bilirubin: 0.6 mg/dL (ref 0.2–1.2)
Total Protein: 6.7 g/dL (ref 6.1–8.1)
eGFR: 78 mL/min/{1.73_m2} (ref >=60)

## 2022-08-25 LAB — LIPID PANEL
Cholesterol: 177 mg/dL (ref <200)
HDL: 50 mg/dL (ref >=50)
LDL Cholesterol (Calc): 113 mg/dL (calc) — ABNORMAL HIGH
Non-HDL Cholesterol (Calc): 127 mg/dL (calc) (ref <130)
Total CHOL/HDL Ratio: 3.5 (calc) (ref <5.0)
Triglycerides: 62 mg/dL (ref <150)

## 2022-08-25 LAB — HEMOGLOBIN A1C
Hgb A1c MFr Bld: 5.5 % of total Hgb (ref <5.7)
Mean Plasma Glucose: 111 mg/dL
eAG (mmol/L): 6.2 mmol/L

## 2022-08-25 LAB — TSH: TSH: 0.66 mIU/L

## 2022-09-22 LAB — HM MAMMOGRAPHY

## 2022-10-05 ENCOUNTER — Encounter: Payer: Self-pay | Admitting: Medical-Surgical

## 2022-10-07 ENCOUNTER — Other Ambulatory Visit: Payer: Self-pay | Admitting: Medical-Surgical

## 2022-10-09 ENCOUNTER — Other Ambulatory Visit: Payer: Self-pay | Admitting: Medical-Surgical

## 2022-10-09 LAB — HM MAMMOGRAPHY

## 2022-10-09 MED ORDER — VEOZAH 45 MG PO TABS: 1.0000 | ORAL_TABLET | Freq: Every day | ORAL | 3 refills | Status: DC

## 2022-10-10 ENCOUNTER — Telehealth: Payer: Self-pay | Admitting: Medical-Surgical

## 2022-10-10 NOTE — Telephone Encounter (Signed)
Fezolinetant (VEOZAH) 45 MG TABS  Insurance does not cover this med.

## 2022-10-11 ENCOUNTER — Other Ambulatory Visit: Payer: Self-pay | Admitting: Medical-Surgical

## 2022-10-11 ENCOUNTER — Telehealth: Payer: Self-pay | Admitting: Medical-Surgical

## 2022-10-11 MED ORDER — VENLAFAXINE HCL ER 37.5 MG PO CP24: 37.5000 mg | ORAL_CAPSULE | Freq: Every day | ORAL | 0 refills | Status: DC

## 2022-10-11 MED ORDER — VENLAFAXINE HCL ER 75 MG PO CP24: 75.0000 mg | ORAL_CAPSULE | Freq: Every day | ORAL | 1 refills | Status: DC

## 2022-10-11 NOTE — Telephone Encounter (Signed)
Message sent to patient via Mychart.

## 2022-10-11 NOTE — Telephone Encounter (Signed)
Sent patient a Mychart message and requested a return call to verify understanding of directions as well.

## 2022-10-11 NOTE — Telephone Encounter (Signed)
Pt called. She would like you to send her via mychart the names of the medications she is being switched. She did not have a pen handy when you all had that conversation.

## 2022-10-11 NOTE — Telephone Encounter (Signed)
Effexor sent to the pharmacy. There will be two prescriptions for this. She should start Effexor at 37.5mg  once daily for 7 days then increase to 75mg  daily. When she starts the Effexor, she will need to start a taper of Trintellix to 10mg  (1/2 tablet) daily for 1 week then stop. We may need to increase the dose of the Effexor to help with mood and such so we will need to follow up in 4 weeks. If she has any concerns between now and then, please have her reach out.

## 2022-10-12 NOTE — Telephone Encounter (Signed)
Patient informed and would like to keep the estradiol patch as is for now.

## 2022-10-30 ENCOUNTER — Encounter: Payer: Self-pay | Admitting: Medical-Surgical

## 2022-11-04 ENCOUNTER — Other Ambulatory Visit: Payer: Self-pay | Admitting: Medical-Surgical

## 2022-11-09 ENCOUNTER — Ambulatory Visit: Payer: 59 | Admitting: Medical-Surgical

## 2022-11-09 ENCOUNTER — Encounter: Payer: Self-pay | Admitting: Medical-Surgical

## 2022-11-09 VITALS — BP 114/77 | HR 88 | Resp 20 | Ht 67.0 in | Wt 257.6 lb

## 2022-11-09 DIAGNOSIS — N951 Menopausal and female climacteric states: Secondary | ICD-10-CM

## 2022-11-09 DIAGNOSIS — F5104 Psychophysiologic insomnia: Secondary | ICD-10-CM | POA: Diagnosis not present

## 2022-11-09 DIAGNOSIS — F411 Generalized anxiety disorder: Secondary | ICD-10-CM

## 2022-11-09 DIAGNOSIS — M5412 Radiculopathy, cervical region: Secondary | ICD-10-CM | POA: Diagnosis not present

## 2022-11-09 DIAGNOSIS — I10 Essential (primary) hypertension: Secondary | ICD-10-CM

## 2022-11-09 MED ORDER — GABAPENTIN 300 MG PO CAPS: 300.0000 mg | ORAL_CAPSULE | Freq: Two times a day (BID) | ORAL | 1 refills | Status: DC

## 2022-11-09 MED ORDER — ALPRAZOLAM 1 MG PO TABS
1.0000 mg | ORAL_TABLET | Freq: Every day | ORAL | 0 refills | Status: DC | PRN
Start: 2022-11-09 — End: 2023-05-10

## 2022-11-09 MED ORDER — VENLAFAXINE HCL ER 150 MG PO CP24: 150.0000 mg | ORAL_CAPSULE | Freq: Every day | ORAL | 1 refills | Status: DC

## 2022-11-09 NOTE — Patient Instructions (Signed)
Gabapentin- increase to 600mg  daily  Effexor- Increase to 150mg  daily  Stop the Climara patch

## 2022-11-09 NOTE — Progress Notes (Signed)
Established patient visit  History, exam, impression, and plan:  1. Menopausal symptoms Pleasant 57 year old female presenting today to follow-up on menopausal symptoms.  She was previously been treated for mental health concerns with Trintellix however with the development of severe hot flashes and night sweats, we made the transition to Effexor.  She was able to complete a short.  Effexor XR 37.5 mg daily before jumping to the 75 mg dose.  No concerning side effects and feels like she is doing well on the medication.  Definitely notes room for improvement as she is still having menopausal symptoms and does still have some breakthrough anxiety/depressive symptoms.  Since she is doing well on the medication, increasing to 150 mg daily.  Plan to continue monitoring symptoms and we will touch base via MyChart message in approximately 4 weeks to see if she has noticed an improvement.  After discussion today, she would like to go ahead and discontinue the Climara patch rather than taper down to the next dose.  2. Generalized anxiety disorder Currently being managed with Effexor however does have periods of severe anxiety that is not responsive to self calming measures and techniques.  Uses alprazolam 1 mg on an as-needed basis with infrequent dosing.  Refilling her prescription for 30 tablets to last 90 days.  Instructions to use only for severe anxiety and avoid use for sleep to prevent tolerance and dependence. - ALPRAZolam (XANAX) 1 MG tablet; Take 1 tablet (1 mg total) by mouth daily as needed for anxiety. 30 tablets must last 90 days  Dispense: 30 tablet; Refill: 0  3. Psychophysiological insomnia Taking amitriptyline 50 mg nightly to help with sleep.  Notes that this is somewhat helpful but not always so.  As we are increasing her Effexor dose, would like to avoid increase in amitriptyline at this time.  Plan to continue as prescribed although we may need to look at other alternatives to help  with sleep in the future.  4. Radiculitis of right cervical region Has been taking gabapentin 300 mg in the morning however does not note that it helps tremendously.  Most of the time that she needs this is during the day when she is busy and working.  Has not tried increasing her dose to twice daily as prescribed as she does not feel she needs it at night as much.  Recommend increasing gabapentin to 600 mg every morning and continue to monitor symptoms.  If this is not helpful, we can certainly do further adjustments or look at alternatives that may help more. - gabapentin (NEURONTIN) 300 MG capsule; Take 1 capsule (300 mg total) by mouth 2 (two) times daily.  Dispense: 180 capsule; Refill: 1  5. Primary hypertension Taking lisinopril-hydrochlorothiazide daily as prescribed.  Tolerating well without side effects.  Following a low-sodium diet.  Trying to stay active and aware of the recommendation for weight loss.  Denies concerning symptoms today.  Blood pressure at goal.  Continue lisinopril-HCTZ as prescribed.  Physical Exam Vitals reviewed.  Constitutional:      General: She is not in acute distress.    Appearance: Normal appearance. She is obese. She is not ill-appearing.  HENT:     Head: Normocephalic and atraumatic.  Cardiovascular:     Rate and Rhythm: Normal rate and regular rhythm.     Pulses: Normal pulses.     Heart sounds: Normal heart sounds. No murmur heard.    No friction rub. No gallop.  Pulmonary:  Effort: Pulmonary effort is normal. No respiratory distress.     Breath sounds: Normal breath sounds. No wheezing.  Skin:    General: Skin is warm and dry.  Neurological:     Mental Status: She is alert and oriented to person, place, and time.  Psychiatric:        Mood and Affect: Mood normal.        Behavior: Behavior normal.        Thought Content: Thought content normal.        Judgment: Judgment normal.    Procedures performed this visit: None.  Return in  about 6 months (around 05/11/2023) for chronic disease follow up.  __________________________________ Thayer Ohm, DNP, APRN, FNP-BC Primary Care and Sports Medicine [facility] [place]

## 2022-11-13 ENCOUNTER — Ambulatory Visit: Payer: 59 | Admitting: Medical-Surgical

## 2022-11-29 ENCOUNTER — Telehealth: Payer: Self-pay | Admitting: Medical-Surgical

## 2022-11-29 NOTE — Telephone Encounter (Signed)
Pt called. Effexor is not working. Can you prescribe something else for hot flashes.

## 2022-12-20 ENCOUNTER — Encounter: Payer: Self-pay | Admitting: Medical-Surgical

## 2023-01-30 ENCOUNTER — Other Ambulatory Visit: Payer: Self-pay | Admitting: Medical-Surgical

## 2023-01-30 DIAGNOSIS — F5104 Psychophysiologic insomnia: Secondary | ICD-10-CM

## 2023-02-05 ENCOUNTER — Other Ambulatory Visit: Payer: Self-pay | Admitting: Medical-Surgical

## 2023-02-05 DIAGNOSIS — I10 Essential (primary) hypertension: Secondary | ICD-10-CM

## 2023-02-06 ENCOUNTER — Telehealth: Payer: Self-pay | Admitting: Medical-Surgical

## 2023-02-06 DIAGNOSIS — F5104 Psychophysiologic insomnia: Secondary | ICD-10-CM

## 2023-02-06 NOTE — Telephone Encounter (Signed)
Patient called in asking for a higher dosage for Effexor 150mg  and also states that her amitriptyline needs more refills per pharm.

## 2023-02-11 MED ORDER — AMITRIPTYLINE HCL 50 MG PO TABS
50.0000 mg | ORAL_TABLET | Freq: Every day | ORAL | 0 refills | Status: DC
Start: 2023-02-11 — End: 2023-03-11

## 2023-02-11 NOTE — Telephone Encounter (Signed)
Sent 30 day supply of the Amitriptyline to the pharmacy.

## 2023-02-25 ENCOUNTER — Encounter: Payer: Self-pay | Admitting: Medical-Surgical

## 2023-02-25 ENCOUNTER — Ambulatory Visit: Payer: 59 | Admitting: Medical-Surgical

## 2023-02-25 VITALS — BP 114/77 | HR 87 | Resp 20 | Ht 67.0 in | Wt 261.1 lb

## 2023-02-25 DIAGNOSIS — F411 Generalized anxiety disorder: Secondary | ICD-10-CM | POA: Diagnosis not present

## 2023-02-25 DIAGNOSIS — M5412 Radiculopathy, cervical region: Secondary | ICD-10-CM

## 2023-02-25 DIAGNOSIS — N951 Menopausal and female climacteric states: Secondary | ICD-10-CM

## 2023-02-25 DIAGNOSIS — I10 Essential (primary) hypertension: Secondary | ICD-10-CM | POA: Diagnosis not present

## 2023-02-25 DIAGNOSIS — F5104 Psychophysiologic insomnia: Secondary | ICD-10-CM | POA: Diagnosis not present

## 2023-02-25 MED ORDER — LISINOPRIL-HYDROCHLOROTHIAZIDE 10-12.5 MG PO TABS: 1.0000 | ORAL_TABLET | Freq: Every day | ORAL | 3 refills | Status: DC

## 2023-02-25 MED ORDER — VENLAFAXINE HCL ER 75 MG PO CP24
75.0000 mg | ORAL_CAPSULE | Freq: Every day | ORAL | 3 refills | Status: DC
Start: 2023-02-25 — End: 2024-01-28

## 2023-02-25 MED ORDER — GABAPENTIN 300 MG PO CAPS: ORAL_CAPSULE | ORAL | 1 refills | Status: DC

## 2023-02-25 NOTE — Progress Notes (Unsigned)
        Established patient visit  History, exam, impression, and plan:  1. Primary hypertension Pleasant 57 year old female presenting today with a history of hypertension currently well-managed with lisinopril-hydrochlorothiazide 10-12.5 mg daily.  Tolerating the medication well without side effects.  Occasionally checking blood pressures at home and works to follow a low-sodium diet.  Continues to work on getting regular intentional activity whenever possible.  Cardiopulmonary exam benign today.  Blood pressure is at goal.  Up-to-date on labs.  Continue lisinopril-HCTZ as prescribed. - lisinopril-hydrochlorothiazide (ZESTORETIC) 10-12.5 MG tablet; Take 1 tablet by mouth daily.  Dispense: 90 tablet; Refill: 3  2. Generalized anxiety disorder Currently taking venlafaxine 150 mg once daily, tolerating well without side effects.  Notes that this seems to work fairly well for her mood however the vasomotor symptoms associated with menopause continue to be a huge issue that affects her overall mental health and wellbeing.  She is interested in increasing her dose of venlafaxine if at all possible.  Of note, she is also taking amitriptyline 50 mg at bedtime to help with sleep.  Has Xanax 1 mg daily as needed and is aware to use this very sparingly and only for severe anxiety that does not respond to other measures.  Denies SI or HI.  After discussion, feel that increasing Effexor would be appropriate to help with mood and vasomotor symptoms.  Increasing Effexor to 225 mg once daily.  Advised that this is the maximum dose and if this is not effective to help with vasomotor symptoms, we may need to look at switching to Pristiq or continuing Effexor and adding Veozah if monetarily feasible.  3. Psychophysiological insomnia As noted above, taking it amitriptyline 50 mg nightly to help with sleep however this does not seem to be working very well for her and she continues to wake multiple times throughout the  night.  Has no difficulty falling asleep with the medication but cannot seem to stay there.  Discussed various options.  Her biggest challenge is the vasomotor symptoms noted above.  In addition to increasing the Effexor, plan to continue amitriptyline 50 mg nightly but adding a bedtime dose of gabapentin 300 mg to see if this assists with sleep maintenance.  4. Radiculitis of right cervical region Has been taking gabapentin 600 mg daily which seems to work very well for her.  Notes that she has had no recent issues with right cervical radiculitis.  As noted above, we are going to tweak her dose and add a small 300 mg dose at bedtime.  Okay to continue 600 mg in the morning. - gabapentin (NEURONTIN) 300 MG capsule; Take 2 capsules (600 mg total) by mouth daily AND 1 capsule (300 mg total) at bedtime.  Dispense: 270 capsule; Refill: 1  5. Menopausal symptoms See above.   Procedures performed this visit: None.  Return in about 4 weeks (around 03/25/2023) for mood/menopausal follow up.  __________________________________ Thayer Ohm, DNP, APRN, FNP-BC Primary Care and Sports Medicine [facility] [place]

## 2023-03-08 ENCOUNTER — Other Ambulatory Visit: Payer: Self-pay | Admitting: Medical-Surgical

## 2023-03-08 DIAGNOSIS — F5104 Psychophysiologic insomnia: Secondary | ICD-10-CM

## 2023-03-22 ENCOUNTER — Telehealth: Payer: Self-pay | Admitting: Medical-Surgical

## 2023-03-22 NOTE — Telephone Encounter (Signed)
FYI  Patient called stating EFFEXOR XR increase is working and

## 2023-05-04 ENCOUNTER — Other Ambulatory Visit: Payer: Self-pay | Admitting: Medical-Surgical

## 2023-05-05 ENCOUNTER — Other Ambulatory Visit: Payer: Self-pay | Admitting: Medical-Surgical

## 2023-05-05 DIAGNOSIS — F5104 Psychophysiologic insomnia: Secondary | ICD-10-CM

## 2023-05-10 ENCOUNTER — Encounter: Payer: Self-pay | Admitting: Medical-Surgical

## 2023-05-10 ENCOUNTER — Ambulatory Visit (INDEPENDENT_AMBULATORY_CARE_PROVIDER_SITE_OTHER): Payer: 59 | Admitting: Medical-Surgical

## 2023-05-10 VITALS — BP 122/79 | HR 88 | Resp 20 | Ht 67.0 in | Wt 262.5 lb

## 2023-05-10 DIAGNOSIS — I10 Essential (primary) hypertension: Secondary | ICD-10-CM

## 2023-05-10 DIAGNOSIS — F411 Generalized anxiety disorder: Secondary | ICD-10-CM

## 2023-05-10 DIAGNOSIS — F5104 Psychophysiologic insomnia: Secondary | ICD-10-CM

## 2023-05-10 MED ORDER — ALPRAZOLAM 1 MG PO TABS
1.0000 mg | ORAL_TABLET | Freq: Every day | ORAL | 0 refills | Status: DC | PRN
Start: 2023-05-10 — End: 2023-10-14

## 2023-05-10 MED ORDER — AMITRIPTYLINE HCL 50 MG PO TABS: ORAL_TABLET | ORAL | 0 refills | Status: DC

## 2023-05-10 NOTE — Progress Notes (Signed)
        Established patient visit  History, exam, impression, and plan:  1. Primary hypertension (Primary) Very pleasant 57 year old female presenting today with a history of primary hypertension.  She is currently taking lisinopril-hydrochlorothiazide 10-12.5 mg daily, tolerating well without side effects.  Has room for improvement in her diet due to recent health struggles with family members and frequent traveling.  Not exercising at this point but knows that she needs to start.  Denies any concerning symptoms today.  Blood pressure is at goal and well-controlled.  Continue lisinopril-HCTZ as prescribed.  Deferring labs today.  Plan to check these with her physical in the next month or 2.  2. Generalized anxiety disorder 3. Psychophysiological insomnia She is currently taking Effexor 225 mg daily, tolerating well without side effects.  Has a lot of situational stressors that are pressing down on her and feels that the medication is doing okay holding its own.  Continues to have significant difficulty sleeping despite use of gabapentin 300 mg at night and amitriptyline 50 mg nightly.  This is better than it was and she is happy to continue her current regimen.  Feels that her mood is stable.  Denies SI/HI.  Continue current regimen for now.  Discussed the risk for serotonin syndrome given that she is on high-dose Effexor and amitriptyline at the same time.  We may need to reevaluate her insomnia medicine in the near future to reduce risks.  For now, information regarding serotonin syndrome and symptoms to monitor for.  If any symptoms arise, advised her to reach out immediately.  Procedures performed this visit: None.  Return for annual physical exam at your convenience.  __________________________________ Thayer Ohm, DNP, APRN, FNP-BC Primary Care and Sports Medicine [facility] [place]

## 2023-05-10 NOTE — Patient Instructions (Signed)
Serotonin Syndrome Serotonin is a chemical that helps to control several functions in the body. This chemical is also called a neurotransmitter. It controls: Brain and nerve cell function. Mood and emotions. Memory. Eating. Sleeping. Sexual activity. Stress response. Having too much serotonin in your body can cause serotonin syndrome. This condition can be harmful to your brain and nerve cells. This can be a life-threatening condition. What are the causes? This condition may be caused by taking medicines or drugs that increase the level of serotonin in your body, such as: Antidepressant medicines. Migraine medicines. Certain pain medicines. Certain drugs, including ecstasy, LSD, cocaine, and amphetamines. Over-the-counter cough or cold medicines that contain dextromethorphan. Certain herbal supplements, including St. John's wort, ginseng, and nutmeg. This condition usually occurs when you take these medicines or drugs together, but it can also happen with a high dose of a single medicine or drug. What increases the risk? You are more likely to develop this condition if: You just started taking a medicine or drug that increases the level of serotonin in the body. You recently increased the dose of a medicine or drug that increases the level of serotonin in the body. You take more than one medicine or drug that increases the level of serotonin in the body. What are the signs or symptoms? Symptoms of this condition usually start within several hours of taking a medicine or drug. Symptoms may be mild or severe. Mild symptoms include: Sweating. Restlessness or agitation. Muscle twitching or stiffness. Rapid heart rate. Nausea, vomiting, or diarrhea. Shivering or goose bumps. Confusion. Severe symptoms include: Irregular heartbeat. Seizures. Loss of consciousness. High fever. How is this diagnosed? This condition may be diagnosed based on: Your medical history. A physical  exam. Your prior use of drugs and medicines. Blood or urine tests. These may be used to rule out other causes of your symptoms. How is this treated? The treatment for this condition depends on the severity of your symptoms. For mild cases, stopping the medicine or drug that caused your condition is usually all that is needed. For moderate to severe cases, treatment in a hospital may be needed to prevent or treat life-threatening symptoms. Treatment may include: Medicines to control your symptoms. IV fluids. Actions to support your breathing. Treatments to control your body temperature. Follow these instructions at home: Medicines  Take over-the-counter and prescription medicines only as told by your health care provider. Check with your health care provider before you start taking any new prescriptions, over-the-counter medicines, herbs, or supplements. Do not combine any medicines that can cause this condition. Lifestyle  Maintain a healthy lifestyle. Eat a healthy diet that includes plenty of vegetables, fruits, whole grains, low-fat dairy products, and lean protein. Do not eat a lot of foods that are high in fat, added sugars, or salt. Get the right amount and quality of sleep. Most adults need 7-9 hours of sleep each night. Make time to exercise, even if it is only for short periods of time. Most adults should exercise for at least 150 minutes each week. Do not drink alcohol. Do not use illegal drugs. Do not take medicines for reasons other than they are prescribed. General instructions Do not use any products that contain nicotine or tobacco. These products include cigarettes, chewing tobacco, and vaping devices, such as e-cigarettes. If you need help quitting, ask your health care provider. Contact a health care provider if: Your symptoms do not improve or they get worse. Get help right away if: You have worsening  confusion, severe headache, chest pain, high fever, seizures, or  loss of consciousness. You experience serious side effects of medicine, such as swelling of your face, lips, tongue, or throat. These symptoms may be an emergency. Get help right away. Call 911. Do not wait to see if the symptoms will go away. Do not drive yourself to the hospital. Also, get help right away if: You have serious thoughts about hurting yourself or others. Take one of these steps if you feel like you may hurt yourself or others, or have thoughts about taking your own life: Go to your nearest emergency room. Call 911. Call the National Suicide Prevention Lifeline at 276-283-0524 or 988. This is open 24 hours a day. Text the Crisis Text Line at 337-730-6713. Summary Serotonin is a chemical that helps to control several functions in the body. High levels of serotonin in the body can cause serotonin syndrome, which can be life-threatening. This condition may be caused by taking medicines or drugs that increase the level of serotonin in your body. Treatment depends on the severity of your symptoms. For mild cases, stopping the medicine or drug that caused your condition is usually all that is needed. Check with your health care provider before you start taking any new prescriptions, over-the-counter medicines, herbs, or supplements. This information is not intended to replace advice given to you by your health care provider. Make sure you discuss any questions you have with your health care provider. Document Revised: 07/20/2021 Document Reviewed: 07/20/2021 Elsevier Patient Education  2024 [employer].

## 2023-05-17 ENCOUNTER — Other Ambulatory Visit: Payer: Self-pay | Admitting: Medical-Surgical

## 2023-05-17 DIAGNOSIS — E785 Hyperlipidemia, unspecified: Secondary | ICD-10-CM | POA: Insufficient documentation

## 2023-05-17 DIAGNOSIS — I10 Essential (primary) hypertension: Secondary | ICD-10-CM

## 2023-05-17 MED ORDER — WEGOVY 0.25 MG/0.5ML ~~LOC~~ SOAJ
0.2500 mg | SUBCUTANEOUS | 0 refills | Status: DC
Start: 2023-05-17 — End: 2023-07-10

## 2023-05-24 ENCOUNTER — Telehealth: Payer: Self-pay | Admitting: Medical-Surgical

## 2023-05-24 NOTE — Telephone Encounter (Signed)
 Initiated Prior authorization for: Wegovy Via: Covermymeds Case/Key: [id number] Status: Pending as of 05/24/23 4:21 PM Notified Pt via: Mychart

## 2023-06-03 ENCOUNTER — Other Ambulatory Visit: Payer: Self-pay

## 2023-06-03 DIAGNOSIS — I10 Essential (primary) hypertension: Secondary | ICD-10-CM

## 2023-06-03 DIAGNOSIS — E785 Hyperlipidemia, unspecified: Secondary | ICD-10-CM

## 2023-06-03 NOTE — Telephone Encounter (Signed)
Spoke with patient. She states she has not yet picked up nor started the [facility].  She will check with the pharmacy regarding this.

## 2023-06-03 NOTE — Telephone Encounter (Signed)
Copied from CRM [id number]. Topic: Clinical - Medical Advice >> Jun 03, 2023 11:13 AM Nila Nephew wrote: Reason for CRM: Patient calling to request a new prescription for [facility], as she has new insurance and is following per previous discussion with PCP.

## 2023-06-03 NOTE — Telephone Encounter (Signed)
Is wegovy strength to be increased ? Requesting new prescription for wegovy Last written 05/17/2023 Last OV 05/10/2023 Upcoming appt =none

## 2023-06-06 NOTE — Telephone Encounter (Signed)
Copied from CRM [id number]. Topic: General - Other >> Jun 06, 2023 10:03 AM Adelina Mings wrote: Reason for CRM: prior authorization needed for WEGOVY 0.25 MG/0.5ML [facility]

## 2023-06-07 NOTE — Telephone Encounter (Unsigned)
Copied from CRM [id number]. Topic: Clinical - Prescription Issue >> Jun 07, 2023 11:43 AM Gildardo Pounds wrote: Reason for CRM: Patient states pharmacy said to call the insurance company for the [facility] prescription. Callback number is [phone]

## 2023-07-05 ENCOUNTER — Other Ambulatory Visit: Payer: Self-pay | Admitting: Medical-Surgical

## 2023-07-05 DIAGNOSIS — E785 Hyperlipidemia, unspecified: Secondary | ICD-10-CM

## 2023-07-05 DIAGNOSIS — I10 Essential (primary) hypertension: Secondary | ICD-10-CM

## 2023-07-05 NOTE — Telephone Encounter (Unsigned)
 Copied from CRM [id number]. Topic: Clinical - Medication Refill >> Jul 05, 2023 11:08 AM Franchot Heidelberg wrote: Most Recent Primary Care Visit:  Provider: Christen Butter  Department: [facility]  Visit Type: OFFICE VISIT  Date: 05/10/2023  Medication: WEGOVY 0.25 MG/0.5ML SOAJ (Pt wants to increase her dose if possible)   Has the patient contacted their pharmacy? Yes (Agent: If no, request that the patient contact the pharmacy for the refill. If patient does not wish to contact the pharmacy document the reason why and proceed with request.) (Agent: If yes, when and what did the pharmacy advise?)  Is this the correct pharmacy for this prescription? Yes If no, delete pharmacy and type the correct one.  This is the patient's preferred pharmacy:  [id number] [address], [place] - [address] [place] [address] Phone: [phone] Fax: [phone]   Has the prescription been filled recently? Yes  Is the patient out of the medication? Yes  Has the patient been seen for an appointment in the last year OR does the patient have an upcoming appointment? Yes  Can we respond through MyChart? Yes  Agent: Please be advised that Rx refills may take up to 3 business days. We ask that you follow-up with your pharmacy.

## 2023-07-09 ENCOUNTER — Other Ambulatory Visit: Payer: Self-pay | Admitting: Medical-Surgical

## 2023-07-09 ENCOUNTER — Encounter: Payer: Self-pay | Admitting: Medical-Surgical

## 2023-07-09 DIAGNOSIS — E785 Hyperlipidemia, unspecified: Secondary | ICD-10-CM

## 2023-07-09 DIAGNOSIS — I10 Essential (primary) hypertension: Secondary | ICD-10-CM

## 2023-07-10 ENCOUNTER — Encounter: Payer: Self-pay | Admitting: Medical-Surgical

## 2023-07-10 MED ORDER — WEGOVY 0.5 MG/0.5ML ~~LOC~~ SOAJ: 0.5000 mg | SUBCUTANEOUS | 0 refills | Status: DC

## 2023-07-10 NOTE — Telephone Encounter (Signed)
 Requesting rx rf of wegovy 0.25/0.47ml Last written 05/17/2023 Last OV 05/10/2023 Upcoming appt = none

## 2023-08-03 ENCOUNTER — Other Ambulatory Visit: Payer: Self-pay | Admitting: Medical-Surgical

## 2023-08-04 ENCOUNTER — Other Ambulatory Visit: Payer: Self-pay | Admitting: Medical-Surgical

## 2023-08-04 DIAGNOSIS — F5104 Psychophysiologic insomnia: Secondary | ICD-10-CM

## 2023-08-05 ENCOUNTER — Encounter: Payer: Self-pay | Admitting: Medical-Surgical

## 2023-09-03 ENCOUNTER — Telehealth: Payer: Self-pay | Admitting: Medical-Surgical

## 2023-09-03 NOTE — Telephone Encounter (Signed)
 Patient took last shot of : WEGOVY  1 MG/0.5ML SOAJ . Patient would like to increase dosing- will need new Rx.  [facility] [employer]

## 2023-09-04 ENCOUNTER — Encounter: Payer: Self-pay | Admitting: Medical-Surgical

## 2023-09-04 MED ORDER — WEGOVY 1.7 MG/0.75ML ~~LOC~~ SOAJ: 1.7000 mg | SUBCUTANEOUS | 0 refills | Status: DC

## 2023-09-04 NOTE — Telephone Encounter (Signed)
 Patient wanting to increase Wegovy  strength Last written as 1mg  on 08/05/2023 Last OV 05/10/2023 Upcoming appt = none

## 2023-09-04 NOTE — Telephone Encounter (Signed)
 Wegovy  1.7 mg weekly for 4 weeks sent to the pharmacy.  For documentation purposes and continued insurance coverage, we will need to her to make an appointment in office for weight check within the next 30 days.  Please contact patient to facilitate scheduling.  ___________________________________________ Maryl Snook, DNP, APRN, FNP-BC Primary Care and Sports Medicine [facility] [place]

## 2023-09-30 ENCOUNTER — Telehealth: Payer: Self-pay | Admitting: Medical-Surgical

## 2023-09-30 MED ORDER — WEGOVY 2.4 MG/0.75ML ~~LOC~~ SOAJ: 2.4000 mg | SUBCUTANEOUS | 11 refills | Status: DC

## 2023-09-30 NOTE — Telephone Encounter (Signed)
 Copied from CRM [id number]. Topic: Clinical - Medication Question >> Sep 30, 2023 11:29 AM Brynn Caras wrote: Reason for CRM: The patient has finished the last injected dosage of Wegovy  1.7 MG, and would like to request the next advised higher dosage to be [facility] [place] [place]. Her next weight check appointment is 10/11/2023 at 3:40p with her PCP.

## 2023-09-30 NOTE — Addendum Note (Signed)
 Addended byCherre Cornish on: 09/30/2023 04:10 PM   Modules accepted: Orders

## 2023-09-30 NOTE — Telephone Encounter (Signed)
 Requesting strength increase of wegovy  Last written as wegovy  1.7mg  09/04/2023 Last OV 05/10/2023 Upcoming appt 10/11/2023

## 2023-09-30 NOTE — Telephone Encounter (Signed)
 Wegovy  2.4 mg weekly sent to the pharmacy on file.

## 2023-10-02 ENCOUNTER — Other Ambulatory Visit: Payer: Self-pay | Admitting: Medical-Surgical

## 2023-10-10 ENCOUNTER — Other Ambulatory Visit: Payer: Self-pay | Admitting: Medical-Surgical

## 2023-10-10 DIAGNOSIS — M5412 Radiculopathy, cervical region: Secondary | ICD-10-CM

## 2023-10-11 ENCOUNTER — Ambulatory Visit: Admitting: Medical-Surgical

## 2023-10-11 ENCOUNTER — Encounter: Payer: Self-pay | Admitting: Medical-Surgical

## 2023-10-11 DIAGNOSIS — M5412 Radiculopathy, cervical region: Secondary | ICD-10-CM

## 2023-10-11 MED ORDER — GABAPENTIN 300 MG PO CAPS: ORAL_CAPSULE | ORAL | 1 refills | Status: DC

## 2023-10-11 NOTE — Progress Notes (Signed)
        Established patient visit  History, exam, impression, and plan:  1. Morbid obesity (HCC) (Primary) Pleasant 58 year old female presenting today for follow up on weight management. She has been taking Wegovy  2.4mg  weekly, tolerating well overall. Complains of difficulty with constipation. Has started taking a stool softener and using Miralax. Stays very active at work but does not exercise outside of that. Reports decreased appetite which helps with portion control. Following a low calorie diet with a goal for high protein intake. Has lost 16.5lbs in the last 5 months and would like to lose at least 40 more. Doing well overall. Continue Wegovy  2.4mg  weekly. Recommend starting regular intentional exercise outside of work. Continue dietary modifications, portion control, and high protein.   2. Radiculitis of right cervical region Taking gabapentin  600mg  every morning with 300mg  at bedtime. Tolerating the medication well without side effects. Feels that it is working well and managing the neck pain. Continue Gabapentin  as prescribed.  - gabapentin  (NEURONTIN ) 300 MG capsule; Take 2 capsules (600 mg total) by mouth daily AND 1 capsule (300 mg total) at bedtime.  Dispense: 270 capsule; Refill: 1  Procedures performed this visit: None.  Return in about 6 months (around 04/12/2024) for weight check.  __________________________________ Maryl Snook, DNP, APRN, FNP-BC Primary Care and Sports Medicine [facility] [place]

## 2023-10-11 NOTE — Patient Instructions (Addendum)
 Magnesium oxide 400-800mg  nightly as needed Colace 100mg  twice daily as needed Miralax 17g twice daily as needed FDgard  Constipation, Adult Constipation is when a person has fewer than three bowel movements in a week, has difficulty having a bowel movement, or has stools (feces) that are dry, hard, or larger than normal. Constipation may be caused by an underlying condition. It may become worse with age if a person takes certain medicines and does not take in enough fluids. Follow these instructions at home: Eating and drinking  Eat foods that have a lot of fiber, such as beans, whole grains, and fresh fruits and vegetables. Limit foods that are low in fiber and high in fat and processed sugars, such as fried or sweet foods. These include french fries, hamburgers, cookies, candies, and soda. Drink enough fluid to keep your urine pale yellow. General instructions Exercise regularly or as told by your health care provider. Try to do 150 minutes of moderate exercise each week. Use the bathroom when you have the urge to go. Do not hold it in. Take over-the-counter and prescription medicines only as told by your health care provider. This includes any fiber supplements. During bowel movements: Practice deep breathing while relaxing the lower abdomen. Practice pelvic floor relaxation. Watch your condition for any changes. Let your health care provider know about them. Keep all follow-up visits as told by your health care provider. This is important. Contact a health care provider if: You have pain that gets worse. You have a fever. You do not have a bowel movement after 4 days. You vomit. You are not hungry or you lose weight. You are bleeding from the opening between the buttocks (anus). You have thin, pencil-like stools. Get help right away if: You have a fever and your symptoms suddenly get worse. You leak stool or have blood in your stool. Your abdomen is bloated. You have severe  pain in your abdomen. You feel dizzy or you faint. Summary Constipation is when a person has fewer than three bowel movements in a week, has difficulty having a bowel movement, or has stools (feces) that are dry, hard, or larger than normal. Eat foods that have a lot of fiber, such as beans, whole grains, and fresh fruits and vegetables. Drink enough fluid to keep your urine pale yellow. Take over-the-counter and prescription medicines only as told by your health care provider. This includes any fiber supplements. This information is not intended to replace advice given to you by your health care provider. Make sure you discuss any questions you have with your health care provider. Document Revised: 03/14/2022 Document Reviewed: 03/14/2022 Elsevier Patient Education  2024 [employer].

## 2023-10-12 ENCOUNTER — Other Ambulatory Visit: Payer: Self-pay | Admitting: Medical-Surgical

## 2023-10-12 DIAGNOSIS — F411 Generalized anxiety disorder: Secondary | ICD-10-CM

## 2023-10-14 NOTE — Telephone Encounter (Signed)
 Last filled 05/10/2023  Last office visit 05/302025  Upcoming appointment 04/07/2024

## 2023-10-23 ENCOUNTER — Other Ambulatory Visit: Payer: Self-pay | Admitting: Medical-Surgical

## 2023-12-27 ENCOUNTER — Other Ambulatory Visit: Payer: Self-pay | Admitting: Medical-Surgical

## 2023-12-27 ENCOUNTER — Telehealth: Payer: Self-pay

## 2023-12-27 ENCOUNTER — Other Ambulatory Visit: Payer: Self-pay

## 2023-12-27 MED ORDER — WEGOVY 2.4 MG/0.75ML ~~LOC~~ SOAJ: 2.4000 mg | SUBCUTANEOUS | 11 refills | Status: DC

## 2023-12-27 NOTE — Telephone Encounter (Signed)
 Please submit for PA

## 2023-12-27 NOTE — Telephone Encounter (Signed)
 Copied from CRM #[id number]. Topic: Clinical - Medication Prior Auth >> Dec 27, 2023  4:35 PM Graeme ORN wrote: Reason for CRM: Patient called states she went to pick up medication WEGOVY  2.4 MG/0.75ML SOAJ - States limit met. Prior Auth required. Next dose due Sunday - none on hand. Thank you.

## 2023-12-27 NOTE — Telephone Encounter (Signed)
 Copied from CRM #[id number]. Topic: Clinical - Medication Prior Auth >> Dec 27, 2023  4:35 PM Graeme ORN wrote: Reason for CRM: Patient called states she went to pick up medication WEGOVY  2.4 MG/0.75ML SOAJ - States limit met. Prior Auth required. Next dose due Sunday - none on hand. Thank you.  Forwarding message to both PA department and Zada Palin, NP   Requesting PA and rx rf of Wegovy  2.4mg   Last written 09/30/2023 Last OV 10/11/2023 Upcoming appt 04/07/2024

## 2023-12-27 NOTE — Telephone Encounter (Signed)
 Forwarding message to PA departemnt  Wegovy  rx rf sent today  Patient states needing PA

## 2023-12-30 ENCOUNTER — Telehealth: Payer: Self-pay

## 2023-12-30 ENCOUNTER — Other Ambulatory Visit (HOSPITAL_COMMUNITY): Payer: Self-pay

## 2023-12-30 NOTE — Telephone Encounter (Signed)
 Pharmacy Patient Advocate Encounter   Received notification from Pt Calls Messages that prior authorization for Wegovy  2.4mg /0.92ml is required/requested.   Insurance verification completed.   The patient is insured through [facility] .   Per test claim: PA required; PA submitted to above mentioned insurance via Prompt PA Key/confirmation #/[id number] Status is pending

## 2023-12-30 NOTE — Telephone Encounter (Signed)
 Faxed PA to [phone]

## 2023-12-31 NOTE — Telephone Encounter (Signed)
 Pharmacy Patient Advocate Encounter  Received notification from [employer] that Prior Authorization for WEGOVY  2.4 MG/0.75ML  has been APPROVED from 12/30/2023 to 12/28/2024.   PA #/Case ID/Reference #: [id number]

## 2023-12-31 NOTE — Telephone Encounter (Signed)
 Copied from CRM #[id number]. Topic: Clinical - Medication Prior Auth >> Dec 27, 2023  4:35 PM Graeme ORN wrote: Reason for CRM: Patient called states she went to pick up medication WEGOVY  2.4 MG/0.75ML SOAJ - States limit met. Prior Auth required. Next dose due Sunday - none on hand. Thank you.

## 2024-01-02 NOTE — Telephone Encounter (Signed)
 Pharmacy Patient Advocate Encounter   Received notification from [employer] that Prior Authorization for WEGOVY  2.4 MG/0.75ML  has been APPROVED from 12/30/2023 to 12/28/2024.

## 2024-01-03 ENCOUNTER — Other Ambulatory Visit (HOSPITAL_COMMUNITY): Payer: Self-pay

## 2024-01-08 NOTE — Telephone Encounter (Signed)
Please sign off on rx in this encounter as PA team is unable to resolve RX requests. Thank you

## 2024-01-10 MED ORDER — WEGOVY 2.4 MG/0.75ML ~~LOC~~ SOAJ: 2.4000 mg | SUBCUTANEOUS | 11 refills | Status: AC

## 2024-01-14 ENCOUNTER — Encounter: Payer: Self-pay | Admitting: Sports Medicine

## 2024-01-19 ENCOUNTER — Other Ambulatory Visit: Payer: Self-pay | Admitting: Medical-Surgical

## 2024-01-21 ENCOUNTER — Other Ambulatory Visit: Payer: Self-pay | Admitting: Medical-Surgical

## 2024-01-27 ENCOUNTER — Other Ambulatory Visit: Payer: Self-pay | Admitting: Medical-Surgical

## 2024-01-28 ENCOUNTER — Other Ambulatory Visit: Payer: Self-pay

## 2024-01-28 NOTE — Telephone Encounter (Signed)
 Requesting rx rf of Venlafaxine   XR 75mg   Last written 02/25/2023 Last OV 10/11/2023 Upcoming appt 04/07/2024

## 2024-01-28 NOTE — Telephone Encounter (Signed)
 Copied from CRM [id number]. Topic: Clinical - Prescription Issue >> Jan 28, 2024 12:21 PM Kevelyn M wrote: Reason for CRM: Patient calling in to request venlafaxine  XR (EFFEXOR -XR) 75 MG 24 hr capsule. Patient states that she has a few left.  [id number] [place] [place], [place] - [address] [place] [address] Phone: [phone]  Fax: [phone]

## 2024-01-29 MED ORDER — VENLAFAXINE HCL ER 75 MG PO CP24: 75.0000 mg | ORAL_CAPSULE | Freq: Every day | ORAL | 3 refills | Status: AC

## 2024-02-02 ENCOUNTER — Other Ambulatory Visit: Payer: Self-pay | Admitting: Medical-Surgical

## 2024-02-02 DIAGNOSIS — F5104 Psychophysiologic insomnia: Secondary | ICD-10-CM

## 2024-03-02 ENCOUNTER — Other Ambulatory Visit: Payer: Self-pay | Admitting: Medical-Surgical

## 2024-03-02 DIAGNOSIS — I10 Essential (primary) hypertension: Secondary | ICD-10-CM

## 2024-04-02 ENCOUNTER — Other Ambulatory Visit: Payer: Self-pay | Admitting: Medical-Surgical

## 2024-04-02 DIAGNOSIS — I10 Essential (primary) hypertension: Secondary | ICD-10-CM

## 2024-04-07 ENCOUNTER — Encounter: Payer: Self-pay | Admitting: Medical-Surgical

## 2024-04-07 ENCOUNTER — Ambulatory Visit: Admitting: Medical-Surgical

## 2024-04-07 DIAGNOSIS — F411 Generalized anxiety disorder: Secondary | ICD-10-CM | POA: Diagnosis not present

## 2024-04-07 DIAGNOSIS — I10 Essential (primary) hypertension: Secondary | ICD-10-CM | POA: Diagnosis not present

## 2024-04-07 MED ORDER — LISINOPRIL-HYDROCHLOROTHIAZIDE 10-12.5 MG PO TABS: 1.0000 | ORAL_TABLET | Freq: Every day | ORAL | 3 refills | Status: AC

## 2024-04-07 MED ORDER — VENLAFAXINE HCL ER 150 MG PO CP24: 150.0000 mg | ORAL_CAPSULE | Freq: Every day | ORAL | 2 refills | Status: AC

## 2024-04-07 MED ORDER — ALPRAZOLAM 1 MG PO TABS: 1.0000 mg | ORAL_TABLET | Freq: Every day | ORAL | 1 refills | Status: AC | PRN

## 2024-04-07 NOTE — Progress Notes (Unsigned)
        Established patient visit   History of Present Illness   Discussed the use of AI scribe software for clinical note transcription with the patient, who gave verbal consent to proceed.  History of Present Illness   Stephanie Rhodes is a 58 year old female who presents for weight management and medication review.  Weight management - Weight decreased from 262.5 pounds to 233 pounds over the past eleven months - Current weight plateau despite ongoing efforts - Dietary modifications include limiting to one plate per meal and reducing portion sizes - Diet consists of a banana and a [employer] bar for breakfast, smaller portions for lunch such as a Lunchable or a peanut butter and jelly sandwich, and typically a meat and a potato for dinner - Physical activity includes walking at work, no structured exercise - Currently taking Wegovy  2.4 mg for weight management with satisfactory tolerance  Medication review - Takes Xanax  once or twice a month and requests a refill - Gabapentin  taken as two in the morning and one at night - Effexor  taken at dose of 225 mg daily   Physical Exam   Physical Exam Vitals reviewed.  Constitutional:      General: She is not in acute distress.    Appearance: Normal appearance. She is obese. She is not ill-appearing.  HENT:     Head: Normocephalic and atraumatic.  Cardiovascular:     Rate and Rhythm: Normal rate and regular rhythm.     Pulses: Normal pulses.     Heart sounds: Normal heart sounds. No murmur heard.    No friction rub. No gallop.  Pulmonary:     Effort: Pulmonary effort is normal. No respiratory distress.     Breath sounds: Normal breath sounds. No wheezing.  Skin:    General: Skin is warm and dry.  Neurological:     Mental Status: She is alert and oriented to person, place, and time.  Psychiatric:        Mood and Affect: Mood normal.        Behavior: Behavior normal.        Thought Content: Thought content normal.         Judgment: Judgment normal.    Assessment & Plan   Morbid obesity (HCC) Weight loss plateaued at 233 lbs. Protein intake below recommended levels. Lack of regular exercise may hinder long-term success. - Increase protein intake to a goal of 120 grams per day. - Ensure caloric intake is at least 1000-1200 calories per day. - Encourage regular exercise including resistance and/or strength training activities. - Continue Wegovy  2.4 mg. - Encouraged use of smaller plates for portion control. - Encouraged exploration of high-protein recipes online. - Encouraged maintaining hydration.   Generalized anxiety disorder Anxiety managed with Xanax , Gabapentin , and Effexor . - Refilled Xanax  prescription for very sparing use. - Continue gabapentin  and Effexor  as prescribed.  Hypertension Well-managed with lisinopril -hydrochlorothiazide  10-12.5mg  daily. Not regularly monitoring at home. No concerning symptoms.  - BP at goal today with no signs/symptoms of hypotension. - Continue Lisinopril -hydrochlorothiazide  as prescribed.  - Encouraged home monitoring at least 2-3 times weekly with a goal of less than 130/80.    Follow up   Return in about 6 months (around 10/05/2024) for chronic disease follow up. __________________________________ Zada FREDRIK Palin, DNP, APRN, FNP-BC Primary Care and Sports Medicine [facility] [place]

## 2024-04-08 ENCOUNTER — Ambulatory Visit: Payer: Self-pay | Admitting: Medical-Surgical

## 2024-04-08 LAB — COMPREHENSIVE METABOLIC PANEL WITH GFR
ALT: 15 IU/L (ref 0–32)
AST: 19 IU/L (ref 0–40)
Albumin: 4.3 g/dL (ref 3.8–4.9)
Alkaline Phosphatase: 98 IU/L (ref 49–135)
BUN/Creatinine Ratio: 16 (ref 9–23)
BUN: 17 mg/dL (ref 6–24)
Bilirubin Total: 0.3 mg/dL (ref 0.0–1.2)
CO2: 25 mmol/L (ref 20–29)
Calcium: 9.5 mg/dL (ref 8.7–10.2)
Chloride: 100 mmol/L (ref 96–106)
Creatinine, Ser: 1.07 mg/dL — ABNORMAL HIGH (ref 0.57–1.00)
Globulin, Total: 2.5 g/dL (ref 1.5–4.5)
Glucose: 82 mg/dL (ref 70–99)
Potassium: 3.9 mmol/L (ref 3.5–5.2)
Sodium: 138 mmol/L (ref 134–144)
Total Protein: 6.8 g/dL (ref 6.0–8.5)
eGFR: 61 mL/min/1.73 (ref >59)

## 2024-04-08 LAB — LIPID PANEL
Chol/HDL Ratio: 3 ratio (ref 0.0–4.4)
Cholesterol, Total: 177 mg/dL (ref 100–199)
HDL: 59 mg/dL (ref >39)
LDL Chol Calc (NIH): 105 mg/dL — ABNORMAL HIGH (ref 0–99)
Triglycerides: 69 mg/dL (ref 0–149)
VLDL Cholesterol Cal: 13 mg/dL (ref 5–40)

## 2024-04-08 LAB — CBC
Hematocrit: 37.2 % (ref 34.0–46.6)
Hemoglobin: 12.3 g/dL (ref 11.1–15.9)
MCH: 31.4 pg (ref 26.6–33.0)
MCHC: 33.1 g/dL (ref 31.5–35.7)
MCV: 95 fL (ref 79–97)
Platelets: 397 x10E3/uL (ref 150–450)
RBC: 3.92 x10E6/uL (ref 3.77–5.28)
RDW: 11.9 % (ref 11.7–15.4)
WBC: 9 x10E3/uL (ref 3.4–10.8)

## 2024-05-14 ENCOUNTER — Other Ambulatory Visit: Payer: Self-pay | Admitting: Medical-Surgical

## 2024-05-14 DIAGNOSIS — M5412 Radiculopathy, cervical region: Secondary | ICD-10-CM

## 2024-10-06 ENCOUNTER — Ambulatory Visit: Admitting: Medical-Surgical
